# Patient Record
Sex: Female | Born: 1945 | Race: White | Hispanic: No | Smoking: Never smoker
Health system: Southern US, Community
[De-identification: ages and names within clinical notes are randomized; demographics above are authoritative.]

## PROBLEM LIST (undated history)

## (undated) DIAGNOSIS — M1990 Unspecified osteoarthritis, unspecified site: Secondary | ICD-10-CM

## (undated) DIAGNOSIS — S83206A Unspecified tear of unspecified meniscus, current injury, right knee, initial encounter: Secondary | ICD-10-CM

## (undated) DIAGNOSIS — E559 Vitamin D deficiency, unspecified: Secondary | ICD-10-CM

## (undated) DIAGNOSIS — F32A Depression, unspecified: Secondary | ICD-10-CM

## (undated) DIAGNOSIS — F419 Anxiety disorder, unspecified: Secondary | ICD-10-CM

## (undated) DIAGNOSIS — E785 Hyperlipidemia, unspecified: Secondary | ICD-10-CM

## (undated) DIAGNOSIS — J45990 Exercise induced bronchospasm: Secondary | ICD-10-CM

## (undated) DIAGNOSIS — K219 Gastro-esophageal reflux disease without esophagitis: Secondary | ICD-10-CM

## (undated) DIAGNOSIS — I10 Essential (primary) hypertension: Secondary | ICD-10-CM

## (undated) DIAGNOSIS — F329 Major depressive disorder, single episode, unspecified: Secondary | ICD-10-CM

## (undated) HISTORY — DX: Hyperlipidemia, unspecified: E78.5

## (undated) HISTORY — PX: FOOT GANGLION EXCISION: SHX1660

## (undated) HISTORY — PX: TUBAL LIGATION: SHX77

## (undated) HISTORY — PX: AUGMENTATION MAMMAPLASTY: SUR837

---

## 1997-07-09 ENCOUNTER — Ambulatory Visit: Admission: RE | Admit: 1997-07-09 | Discharge: 1997-07-09 | Payer: Self-pay | Admitting: Plastic Surgery

## 1997-07-19 ENCOUNTER — Other Ambulatory Visit: Admission: RE | Admit: 1997-07-19 | Discharge: 1997-07-19 | Payer: Self-pay | Admitting: Plastic Surgery

## 1997-11-23 ENCOUNTER — Other Ambulatory Visit: Admission: RE | Admit: 1997-11-23 | Discharge: 1997-11-23 | Payer: Self-pay | Admitting: *Deleted

## 1998-11-22 ENCOUNTER — Encounter: Payer: Self-pay | Admitting: *Deleted

## 1998-11-22 ENCOUNTER — Ambulatory Visit (HOSPITAL_COMMUNITY): Admission: RE | Admit: 1998-11-22 | Discharge: 1998-11-22 | Payer: Self-pay | Admitting: *Deleted

## 1998-11-27 ENCOUNTER — Other Ambulatory Visit: Admission: RE | Admit: 1998-11-27 | Discharge: 1998-11-27 | Payer: Self-pay | Admitting: *Deleted

## 1998-12-04 ENCOUNTER — Ambulatory Visit (HOSPITAL_COMMUNITY): Admission: RE | Admit: 1998-12-04 | Discharge: 1998-12-04 | Payer: Self-pay | Admitting: *Deleted

## 1999-12-10 ENCOUNTER — Ambulatory Visit (HOSPITAL_COMMUNITY): Admission: RE | Admit: 1999-12-10 | Discharge: 1999-12-10 | Payer: Self-pay | Admitting: *Deleted

## 1999-12-10 ENCOUNTER — Encounter: Payer: Self-pay | Admitting: *Deleted

## 1999-12-22 ENCOUNTER — Other Ambulatory Visit: Admission: RE | Admit: 1999-12-22 | Discharge: 1999-12-22 | Payer: Self-pay | Admitting: *Deleted

## 2000-12-27 ENCOUNTER — Encounter: Payer: Self-pay | Admitting: *Deleted

## 2000-12-27 ENCOUNTER — Other Ambulatory Visit: Admission: RE | Admit: 2000-12-27 | Discharge: 2000-12-27 | Payer: Self-pay | Admitting: *Deleted

## 2000-12-27 ENCOUNTER — Ambulatory Visit (HOSPITAL_COMMUNITY): Admission: RE | Admit: 2000-12-27 | Discharge: 2000-12-27 | Payer: Self-pay | Admitting: *Deleted

## 2001-05-27 ENCOUNTER — Encounter: Payer: Self-pay | Admitting: Internal Medicine

## 2001-05-27 ENCOUNTER — Encounter: Admission: RE | Admit: 2001-05-27 | Discharge: 2001-05-27 | Payer: Self-pay | Admitting: Internal Medicine

## 2001-12-28 ENCOUNTER — Other Ambulatory Visit: Admission: RE | Admit: 2001-12-28 | Discharge: 2001-12-28 | Payer: Self-pay | Admitting: *Deleted

## 2002-01-24 ENCOUNTER — Ambulatory Visit (HOSPITAL_COMMUNITY): Admission: RE | Admit: 2002-01-24 | Discharge: 2002-01-24 | Payer: Self-pay | Admitting: *Deleted

## 2002-01-24 ENCOUNTER — Encounter: Payer: Self-pay | Admitting: *Deleted

## 2002-03-30 HISTORY — PX: NASAL SINUS SURGERY: SHX719

## 2002-07-26 ENCOUNTER — Other Ambulatory Visit: Admission: RE | Admit: 2002-07-26 | Discharge: 2002-07-26 | Payer: Self-pay | Admitting: *Deleted

## 2002-10-03 ENCOUNTER — Encounter: Admission: RE | Admit: 2002-10-03 | Discharge: 2002-10-03 | Payer: Self-pay | Admitting: Internal Medicine

## 2002-10-03 ENCOUNTER — Encounter: Payer: Self-pay | Admitting: Internal Medicine

## 2003-01-12 ENCOUNTER — Other Ambulatory Visit: Admission: RE | Admit: 2003-01-12 | Discharge: 2003-01-12 | Payer: Self-pay | Admitting: *Deleted

## 2003-04-02 ENCOUNTER — Ambulatory Visit (HOSPITAL_COMMUNITY): Admission: RE | Admit: 2003-04-02 | Discharge: 2003-04-02 | Payer: Self-pay | Admitting: Internal Medicine

## 2004-01-16 ENCOUNTER — Other Ambulatory Visit: Admission: RE | Admit: 2004-01-16 | Discharge: 2004-01-16 | Payer: Self-pay | Admitting: *Deleted

## 2004-05-08 ENCOUNTER — Ambulatory Visit (HOSPITAL_COMMUNITY): Admission: RE | Admit: 2004-05-08 | Discharge: 2004-05-08 | Payer: Self-pay | Admitting: *Deleted

## 2005-01-26 ENCOUNTER — Other Ambulatory Visit: Admission: RE | Admit: 2005-01-26 | Discharge: 2005-01-26 | Payer: Self-pay | Admitting: Obstetrics and Gynecology

## 2005-06-16 ENCOUNTER — Ambulatory Visit (HOSPITAL_COMMUNITY): Admission: RE | Admit: 2005-06-16 | Discharge: 2005-06-16 | Payer: Self-pay | Admitting: Internal Medicine

## 2006-06-28 ENCOUNTER — Other Ambulatory Visit: Admission: RE | Admit: 2006-06-28 | Discharge: 2006-06-28 | Payer: Self-pay | Admitting: Obstetrics and Gynecology

## 2006-06-29 ENCOUNTER — Ambulatory Visit (HOSPITAL_COMMUNITY): Admission: RE | Admit: 2006-06-29 | Discharge: 2006-06-29 | Payer: Self-pay | Admitting: Internal Medicine

## 2006-11-03 ENCOUNTER — Emergency Department (HOSPITAL_COMMUNITY): Admission: EM | Admit: 2006-11-03 | Discharge: 2006-11-03 | Payer: Self-pay | Admitting: Emergency Medicine

## 2007-08-08 ENCOUNTER — Ambulatory Visit (HOSPITAL_COMMUNITY): Admission: RE | Admit: 2007-08-08 | Discharge: 2007-08-08 | Payer: Self-pay | Admitting: Internal Medicine

## 2009-01-08 ENCOUNTER — Ambulatory Visit (HOSPITAL_COMMUNITY): Admission: RE | Admit: 2009-01-08 | Discharge: 2009-01-08 | Payer: Self-pay | Admitting: Internal Medicine

## 2010-03-30 HISTORY — PX: TOTAL HIP ARTHROPLASTY: SHX124

## 2011-07-02 DIAGNOSIS — H40009 Preglaucoma, unspecified, unspecified eye: Secondary | ICD-10-CM | POA: Diagnosis not present

## 2011-07-02 DIAGNOSIS — H43819 Vitreous degeneration, unspecified eye: Secondary | ICD-10-CM | POA: Diagnosis not present

## 2011-07-28 DIAGNOSIS — M25559 Pain in unspecified hip: Secondary | ICD-10-CM | POA: Diagnosis not present

## 2011-07-29 DIAGNOSIS — M169 Osteoarthritis of hip, unspecified: Secondary | ICD-10-CM | POA: Diagnosis not present

## 2011-07-29 DIAGNOSIS — S79929A Unspecified injury of unspecified thigh, initial encounter: Secondary | ICD-10-CM | POA: Diagnosis not present

## 2011-07-29 DIAGNOSIS — S79919A Unspecified injury of unspecified hip, initial encounter: Secondary | ICD-10-CM | POA: Diagnosis not present

## 2011-07-29 DIAGNOSIS — M25559 Pain in unspecified hip: Secondary | ICD-10-CM | POA: Diagnosis not present

## 2011-08-04 DIAGNOSIS — M1610 Unilateral primary osteoarthritis, unspecified hip: Secondary | ICD-10-CM | POA: Diagnosis not present

## 2011-10-06 DIAGNOSIS — M169 Osteoarthritis of hip, unspecified: Secondary | ICD-10-CM | POA: Diagnosis not present

## 2011-10-06 DIAGNOSIS — M25559 Pain in unspecified hip: Secondary | ICD-10-CM | POA: Diagnosis not present

## 2011-10-09 DIAGNOSIS — M25559 Pain in unspecified hip: Secondary | ICD-10-CM | POA: Diagnosis not present

## 2011-10-09 DIAGNOSIS — M169 Osteoarthritis of hip, unspecified: Secondary | ICD-10-CM | POA: Diagnosis not present

## 2011-10-19 DIAGNOSIS — F329 Major depressive disorder, single episode, unspecified: Secondary | ICD-10-CM | POA: Diagnosis present

## 2011-10-19 DIAGNOSIS — M25559 Pain in unspecified hip: Secondary | ICD-10-CM | POA: Diagnosis not present

## 2011-10-19 DIAGNOSIS — E871 Hypo-osmolality and hyponatremia: Secondary | ICD-10-CM | POA: Diagnosis not present

## 2011-10-19 DIAGNOSIS — M169 Osteoarthritis of hip, unspecified: Secondary | ICD-10-CM | POA: Diagnosis present

## 2011-10-19 DIAGNOSIS — R262 Difficulty in walking, not elsewhere classified: Secondary | ICD-10-CM | POA: Diagnosis not present

## 2011-10-19 DIAGNOSIS — F3289 Other specified depressive episodes: Secondary | ICD-10-CM | POA: Diagnosis not present

## 2011-10-19 DIAGNOSIS — M353 Polymyalgia rheumatica: Secondary | ICD-10-CM | POA: Diagnosis not present

## 2011-10-19 DIAGNOSIS — M1610 Unilateral primary osteoarthritis, unspecified hip: Secondary | ICD-10-CM | POA: Diagnosis not present

## 2011-10-19 DIAGNOSIS — Z96649 Presence of unspecified artificial hip joint: Secondary | ICD-10-CM | POA: Diagnosis not present

## 2011-10-19 DIAGNOSIS — M6281 Muscle weakness (generalized): Secondary | ICD-10-CM | POA: Diagnosis not present

## 2011-10-19 DIAGNOSIS — Z471 Aftercare following joint replacement surgery: Secondary | ICD-10-CM | POA: Diagnosis not present

## 2011-10-23 DIAGNOSIS — R262 Difficulty in walking, not elsewhere classified: Secondary | ICD-10-CM | POA: Diagnosis not present

## 2011-10-23 DIAGNOSIS — M25559 Pain in unspecified hip: Secondary | ICD-10-CM | POA: Diagnosis not present

## 2011-10-23 DIAGNOSIS — M169 Osteoarthritis of hip, unspecified: Secondary | ICD-10-CM | POA: Diagnosis not present

## 2011-10-26 DIAGNOSIS — R262 Difficulty in walking, not elsewhere classified: Secondary | ICD-10-CM | POA: Diagnosis not present

## 2011-10-26 DIAGNOSIS — G4700 Insomnia, unspecified: Secondary | ICD-10-CM | POA: Diagnosis not present

## 2011-10-26 DIAGNOSIS — M1610 Unilateral primary osteoarthritis, unspecified hip: Secondary | ICD-10-CM | POA: Diagnosis not present

## 2011-10-26 DIAGNOSIS — M169 Osteoarthritis of hip, unspecified: Secondary | ICD-10-CM | POA: Diagnosis not present

## 2011-10-26 DIAGNOSIS — M25559 Pain in unspecified hip: Secondary | ICD-10-CM | POA: Diagnosis not present

## 2011-10-28 DIAGNOSIS — M25559 Pain in unspecified hip: Secondary | ICD-10-CM | POA: Diagnosis not present

## 2011-10-28 DIAGNOSIS — M169 Osteoarthritis of hip, unspecified: Secondary | ICD-10-CM | POA: Diagnosis not present

## 2011-10-28 DIAGNOSIS — R262 Difficulty in walking, not elsewhere classified: Secondary | ICD-10-CM | POA: Diagnosis not present

## 2011-10-29 DIAGNOSIS — M169 Osteoarthritis of hip, unspecified: Secondary | ICD-10-CM | POA: Diagnosis not present

## 2011-10-30 DIAGNOSIS — M25559 Pain in unspecified hip: Secondary | ICD-10-CM | POA: Diagnosis not present

## 2011-10-30 DIAGNOSIS — R262 Difficulty in walking, not elsewhere classified: Secondary | ICD-10-CM | POA: Diagnosis not present

## 2011-10-30 DIAGNOSIS — M169 Osteoarthritis of hip, unspecified: Secondary | ICD-10-CM | POA: Diagnosis not present

## 2011-11-02 DIAGNOSIS — M25559 Pain in unspecified hip: Secondary | ICD-10-CM | POA: Diagnosis not present

## 2011-11-02 DIAGNOSIS — M169 Osteoarthritis of hip, unspecified: Secondary | ICD-10-CM | POA: Diagnosis not present

## 2011-11-02 DIAGNOSIS — R262 Difficulty in walking, not elsewhere classified: Secondary | ICD-10-CM | POA: Diagnosis not present

## 2011-11-04 DIAGNOSIS — M25559 Pain in unspecified hip: Secondary | ICD-10-CM | POA: Diagnosis not present

## 2011-11-04 DIAGNOSIS — R262 Difficulty in walking, not elsewhere classified: Secondary | ICD-10-CM | POA: Diagnosis not present

## 2011-11-04 DIAGNOSIS — M169 Osteoarthritis of hip, unspecified: Secondary | ICD-10-CM | POA: Diagnosis not present

## 2011-11-06 DIAGNOSIS — M25559 Pain in unspecified hip: Secondary | ICD-10-CM | POA: Diagnosis not present

## 2011-11-06 DIAGNOSIS — M169 Osteoarthritis of hip, unspecified: Secondary | ICD-10-CM | POA: Diagnosis not present

## 2011-11-06 DIAGNOSIS — R262 Difficulty in walking, not elsewhere classified: Secondary | ICD-10-CM | POA: Diagnosis not present

## 2011-11-09 DIAGNOSIS — M25559 Pain in unspecified hip: Secondary | ICD-10-CM | POA: Diagnosis not present

## 2011-11-09 DIAGNOSIS — R262 Difficulty in walking, not elsewhere classified: Secondary | ICD-10-CM | POA: Diagnosis not present

## 2011-11-09 DIAGNOSIS — M169 Osteoarthritis of hip, unspecified: Secondary | ICD-10-CM | POA: Diagnosis not present

## 2011-11-11 DIAGNOSIS — M169 Osteoarthritis of hip, unspecified: Secondary | ICD-10-CM | POA: Diagnosis not present

## 2011-11-11 DIAGNOSIS — M25559 Pain in unspecified hip: Secondary | ICD-10-CM | POA: Diagnosis not present

## 2011-11-11 DIAGNOSIS — R262 Difficulty in walking, not elsewhere classified: Secondary | ICD-10-CM | POA: Diagnosis not present

## 2011-11-16 DIAGNOSIS — M169 Osteoarthritis of hip, unspecified: Secondary | ICD-10-CM | POA: Diagnosis not present

## 2011-11-16 DIAGNOSIS — R262 Difficulty in walking, not elsewhere classified: Secondary | ICD-10-CM | POA: Diagnosis not present

## 2011-11-16 DIAGNOSIS — M25559 Pain in unspecified hip: Secondary | ICD-10-CM | POA: Diagnosis not present

## 2011-11-18 DIAGNOSIS — M169 Osteoarthritis of hip, unspecified: Secondary | ICD-10-CM | POA: Diagnosis not present

## 2011-11-18 DIAGNOSIS — M25559 Pain in unspecified hip: Secondary | ICD-10-CM | POA: Diagnosis not present

## 2011-11-18 DIAGNOSIS — R262 Difficulty in walking, not elsewhere classified: Secondary | ICD-10-CM | POA: Diagnosis not present

## 2011-11-23 DIAGNOSIS — M169 Osteoarthritis of hip, unspecified: Secondary | ICD-10-CM | POA: Diagnosis not present

## 2011-11-23 DIAGNOSIS — M25559 Pain in unspecified hip: Secondary | ICD-10-CM | POA: Diagnosis not present

## 2011-11-23 DIAGNOSIS — R262 Difficulty in walking, not elsewhere classified: Secondary | ICD-10-CM | POA: Diagnosis not present

## 2011-11-25 DIAGNOSIS — M25559 Pain in unspecified hip: Secondary | ICD-10-CM | POA: Diagnosis not present

## 2011-11-25 DIAGNOSIS — R262 Difficulty in walking, not elsewhere classified: Secondary | ICD-10-CM | POA: Diagnosis not present

## 2011-11-25 DIAGNOSIS — M169 Osteoarthritis of hip, unspecified: Secondary | ICD-10-CM | POA: Diagnosis not present

## 2011-12-01 DIAGNOSIS — R262 Difficulty in walking, not elsewhere classified: Secondary | ICD-10-CM | POA: Diagnosis not present

## 2011-12-01 DIAGNOSIS — M25559 Pain in unspecified hip: Secondary | ICD-10-CM | POA: Diagnosis not present

## 2011-12-01 DIAGNOSIS — M169 Osteoarthritis of hip, unspecified: Secondary | ICD-10-CM | POA: Diagnosis not present

## 2011-12-03 DIAGNOSIS — M25559 Pain in unspecified hip: Secondary | ICD-10-CM | POA: Diagnosis not present

## 2011-12-03 DIAGNOSIS — R262 Difficulty in walking, not elsewhere classified: Secondary | ICD-10-CM | POA: Diagnosis not present

## 2011-12-03 DIAGNOSIS — M169 Osteoarthritis of hip, unspecified: Secondary | ICD-10-CM | POA: Diagnosis not present

## 2011-12-07 DIAGNOSIS — R262 Difficulty in walking, not elsewhere classified: Secondary | ICD-10-CM | POA: Diagnosis not present

## 2011-12-07 DIAGNOSIS — M25559 Pain in unspecified hip: Secondary | ICD-10-CM | POA: Diagnosis not present

## 2011-12-07 DIAGNOSIS — M169 Osteoarthritis of hip, unspecified: Secondary | ICD-10-CM | POA: Diagnosis not present

## 2012-02-04 DIAGNOSIS — H43819 Vitreous degeneration, unspecified eye: Secondary | ICD-10-CM | POA: Diagnosis not present

## 2012-02-04 DIAGNOSIS — H40019 Open angle with borderline findings, low risk, unspecified eye: Secondary | ICD-10-CM | POA: Diagnosis not present

## 2012-02-19 DIAGNOSIS — M899 Disorder of bone, unspecified: Secondary | ICD-10-CM | POA: Diagnosis not present

## 2012-02-19 DIAGNOSIS — M949 Disorder of cartilage, unspecified: Secondary | ICD-10-CM | POA: Diagnosis not present

## 2012-02-19 DIAGNOSIS — E7800 Pure hypercholesterolemia, unspecified: Secondary | ICD-10-CM | POA: Diagnosis not present

## 2012-02-19 DIAGNOSIS — M353 Polymyalgia rheumatica: Secondary | ICD-10-CM | POA: Diagnosis not present

## 2012-03-11 DIAGNOSIS — R05 Cough: Secondary | ICD-10-CM | POA: Diagnosis not present

## 2012-03-11 DIAGNOSIS — R059 Cough, unspecified: Secondary | ICD-10-CM | POA: Diagnosis not present

## 2012-03-21 DIAGNOSIS — M169 Osteoarthritis of hip, unspecified: Secondary | ICD-10-CM | POA: Diagnosis not present

## 2012-04-20 DIAGNOSIS — H40019 Open angle with borderline findings, low risk, unspecified eye: Secondary | ICD-10-CM | POA: Diagnosis not present

## 2012-10-06 ENCOUNTER — Other Ambulatory Visit (HOSPITAL_COMMUNITY): Payer: Self-pay | Admitting: Internal Medicine

## 2012-10-06 DIAGNOSIS — Z1331 Encounter for screening for depression: Secondary | ICD-10-CM | POA: Diagnosis not present

## 2012-10-06 DIAGNOSIS — E7800 Pure hypercholesterolemia, unspecified: Secondary | ICD-10-CM | POA: Diagnosis not present

## 2012-10-06 DIAGNOSIS — Z131 Encounter for screening for diabetes mellitus: Secondary | ICD-10-CM | POA: Diagnosis not present

## 2012-10-06 DIAGNOSIS — Z1231 Encounter for screening mammogram for malignant neoplasm of breast: Secondary | ICD-10-CM

## 2012-10-06 DIAGNOSIS — Z0000 Encounter for general adult medical examination without abnormal findings: Secondary | ICD-10-CM | POA: Diagnosis not present

## 2012-10-06 DIAGNOSIS — J45909 Unspecified asthma, uncomplicated: Secondary | ICD-10-CM | POA: Diagnosis not present

## 2012-10-06 DIAGNOSIS — M949 Disorder of cartilage, unspecified: Secondary | ICD-10-CM | POA: Diagnosis not present

## 2012-10-06 DIAGNOSIS — M899 Disorder of bone, unspecified: Secondary | ICD-10-CM | POA: Diagnosis not present

## 2012-10-07 DIAGNOSIS — J45909 Unspecified asthma, uncomplicated: Secondary | ICD-10-CM | POA: Diagnosis not present

## 2012-10-18 DIAGNOSIS — F411 Generalized anxiety disorder: Secondary | ICD-10-CM | POA: Diagnosis not present

## 2012-10-18 DIAGNOSIS — F330 Major depressive disorder, recurrent, mild: Secondary | ICD-10-CM | POA: Diagnosis not present

## 2012-10-19 ENCOUNTER — Ambulatory Visit (HOSPITAL_COMMUNITY)
Admission: RE | Admit: 2012-10-19 | Discharge: 2012-10-19 | Disposition: A | Discharge status: Home / Self Care | Payer: Medicare Other | Source: Ambulatory Visit | Attending: Internal Medicine | Admitting: Internal Medicine

## 2012-10-19 DIAGNOSIS — Z1231 Encounter for screening mammogram for malignant neoplasm of breast: Secondary | ICD-10-CM | POA: Diagnosis not present

## 2012-11-10 DIAGNOSIS — F330 Major depressive disorder, recurrent, mild: Secondary | ICD-10-CM | POA: Diagnosis not present

## 2012-11-10 DIAGNOSIS — F411 Generalized anxiety disorder: Secondary | ICD-10-CM | POA: Diagnosis not present

## 2012-11-29 DIAGNOSIS — M899 Disorder of bone, unspecified: Secondary | ICD-10-CM | POA: Diagnosis not present

## 2012-12-12 DIAGNOSIS — F330 Major depressive disorder, recurrent, mild: Secondary | ICD-10-CM | POA: Diagnosis not present

## 2013-01-12 DIAGNOSIS — F330 Major depressive disorder, recurrent, mild: Secondary | ICD-10-CM | POA: Diagnosis not present

## 2013-01-12 DIAGNOSIS — F410 Panic disorder [episodic paroxysmal anxiety] without agoraphobia: Secondary | ICD-10-CM | POA: Diagnosis not present

## 2013-03-13 DIAGNOSIS — F330 Major depressive disorder, recurrent, mild: Secondary | ICD-10-CM | POA: Diagnosis not present

## 2013-05-04 ENCOUNTER — Other Ambulatory Visit: Payer: Self-pay | Admitting: Internal Medicine

## 2013-05-04 ENCOUNTER — Other Ambulatory Visit (HOSPITAL_COMMUNITY)
Admission: RE | Admit: 2013-05-04 | Discharge: 2013-05-04 | Disposition: A | Discharge status: Home / Self Care | Payer: Medicare Other | Source: Ambulatory Visit | Attending: Internal Medicine | Admitting: Internal Medicine

## 2013-05-04 DIAGNOSIS — Z1151 Encounter for screening for human papillomavirus (HPV): Secondary | ICD-10-CM | POA: Insufficient documentation

## 2013-05-04 DIAGNOSIS — Z01419 Encounter for gynecological examination (general) (routine) without abnormal findings: Secondary | ICD-10-CM | POA: Insufficient documentation

## 2013-05-04 DIAGNOSIS — N949 Unspecified condition associated with female genital organs and menstrual cycle: Secondary | ICD-10-CM | POA: Diagnosis not present

## 2013-05-10 DIAGNOSIS — N949 Unspecified condition associated with female genital organs and menstrual cycle: Secondary | ICD-10-CM | POA: Diagnosis not present

## 2013-05-17 ENCOUNTER — Other Ambulatory Visit: Payer: Self-pay | Admitting: Internal Medicine

## 2013-05-17 DIAGNOSIS — K5732 Diverticulitis of large intestine without perforation or abscess without bleeding: Secondary | ICD-10-CM | POA: Diagnosis not present

## 2013-05-17 DIAGNOSIS — K5792 Diverticulitis of intestine, part unspecified, without perforation or abscess without bleeding: Secondary | ICD-10-CM

## 2013-05-19 ENCOUNTER — Ambulatory Visit
Admission: RE | Admit: 2013-05-19 | Discharge: 2013-05-19 | Disposition: A | Discharge status: Home / Self Care | Payer: Medicare Other | Source: Ambulatory Visit | Attending: Internal Medicine | Admitting: Internal Medicine

## 2013-05-19 DIAGNOSIS — K5792 Diverticulitis of intestine, part unspecified, without perforation or abscess without bleeding: Secondary | ICD-10-CM

## 2013-05-19 DIAGNOSIS — R1032 Left lower quadrant pain: Secondary | ICD-10-CM | POA: Diagnosis not present

## 2013-05-19 MED ORDER — IOHEXOL 300 MG/ML  SOLN
100.0000 mL | Freq: Once | INTRAMUSCULAR | Status: AC | PRN
  Administered 2013-05-19: 100 mL via INTRAVENOUS

## 2013-06-01 DIAGNOSIS — F411 Generalized anxiety disorder: Secondary | ICD-10-CM | POA: Diagnosis not present

## 2013-06-01 DIAGNOSIS — F330 Major depressive disorder, recurrent, mild: Secondary | ICD-10-CM | POA: Diagnosis not present

## 2013-09-04 DIAGNOSIS — F3131 Bipolar disorder, current episode depressed, mild: Secondary | ICD-10-CM | POA: Diagnosis not present

## 2013-11-02 DIAGNOSIS — F329 Major depressive disorder, single episode, unspecified: Secondary | ICD-10-CM | POA: Diagnosis not present

## 2013-11-02 DIAGNOSIS — M949 Disorder of cartilage, unspecified: Secondary | ICD-10-CM | POA: Diagnosis not present

## 2013-11-02 DIAGNOSIS — J45909 Unspecified asthma, uncomplicated: Secondary | ICD-10-CM | POA: Diagnosis not present

## 2013-11-02 DIAGNOSIS — Z23 Encounter for immunization: Secondary | ICD-10-CM | POA: Diagnosis not present

## 2013-11-02 DIAGNOSIS — E7800 Pure hypercholesterolemia, unspecified: Secondary | ICD-10-CM | POA: Diagnosis not present

## 2013-11-02 DIAGNOSIS — M899 Disorder of bone, unspecified: Secondary | ICD-10-CM | POA: Diagnosis not present

## 2013-11-02 DIAGNOSIS — Z1331 Encounter for screening for depression: Secondary | ICD-10-CM | POA: Diagnosis not present

## 2013-11-02 DIAGNOSIS — Z0000 Encounter for general adult medical examination without abnormal findings: Secondary | ICD-10-CM | POA: Diagnosis not present

## 2013-11-06 ENCOUNTER — Other Ambulatory Visit (HOSPITAL_COMMUNITY): Payer: Self-pay | Admitting: Internal Medicine

## 2013-11-06 DIAGNOSIS — Z1231 Encounter for screening mammogram for malignant neoplasm of breast: Secondary | ICD-10-CM

## 2013-11-10 ENCOUNTER — Ambulatory Visit (HOSPITAL_COMMUNITY)
Admission: RE | Admit: 2013-11-10 | Discharge: 2013-11-10 | Disposition: A | Discharge status: Home / Self Care | Payer: Medicare Other | Source: Ambulatory Visit | Attending: Internal Medicine | Admitting: Internal Medicine

## 2013-11-10 DIAGNOSIS — Z1231 Encounter for screening mammogram for malignant neoplasm of breast: Secondary | ICD-10-CM | POA: Diagnosis not present

## 2013-11-29 DIAGNOSIS — F3131 Bipolar disorder, current episode depressed, mild: Secondary | ICD-10-CM | POA: Diagnosis not present

## 2014-04-06 DIAGNOSIS — J069 Acute upper respiratory infection, unspecified: Secondary | ICD-10-CM | POA: Diagnosis not present

## 2014-04-18 DIAGNOSIS — Z471 Aftercare following joint replacement surgery: Secondary | ICD-10-CM | POA: Diagnosis not present

## 2014-04-18 DIAGNOSIS — Z96641 Presence of right artificial hip joint: Secondary | ICD-10-CM | POA: Diagnosis not present

## 2014-07-25 DIAGNOSIS — F3341 Major depressive disorder, recurrent, in partial remission: Secondary | ICD-10-CM | POA: Diagnosis not present

## 2014-09-05 DIAGNOSIS — Z471 Aftercare following joint replacement surgery: Secondary | ICD-10-CM | POA: Diagnosis not present

## 2014-09-05 DIAGNOSIS — M5416 Radiculopathy, lumbar region: Secondary | ICD-10-CM | POA: Diagnosis not present

## 2014-09-05 DIAGNOSIS — Z96641 Presence of right artificial hip joint: Secondary | ICD-10-CM | POA: Diagnosis not present

## 2014-09-26 DIAGNOSIS — M5416 Radiculopathy, lumbar region: Secondary | ICD-10-CM | POA: Diagnosis not present

## 2014-11-01 DIAGNOSIS — M5416 Radiculopathy, lumbar region: Secondary | ICD-10-CM | POA: Diagnosis not present

## 2014-11-01 DIAGNOSIS — M25561 Pain in right knee: Secondary | ICD-10-CM | POA: Diagnosis not present

## 2014-11-12 DIAGNOSIS — M25561 Pain in right knee: Secondary | ICD-10-CM | POA: Diagnosis not present

## 2014-11-13 DIAGNOSIS — K5792 Diverticulitis of intestine, part unspecified, without perforation or abscess without bleeding: Secondary | ICD-10-CM | POA: Diagnosis not present

## 2014-11-13 DIAGNOSIS — F411 Generalized anxiety disorder: Secondary | ICD-10-CM | POA: Diagnosis not present

## 2014-11-13 DIAGNOSIS — Z23 Encounter for immunization: Secondary | ICD-10-CM | POA: Diagnosis not present

## 2014-11-13 DIAGNOSIS — Z79899 Other long term (current) drug therapy: Secondary | ICD-10-CM | POA: Diagnosis not present

## 2014-11-13 DIAGNOSIS — E559 Vitamin D deficiency, unspecified: Secondary | ICD-10-CM | POA: Diagnosis not present

## 2014-11-13 DIAGNOSIS — F331 Major depressive disorder, recurrent, moderate: Secondary | ICD-10-CM | POA: Diagnosis not present

## 2014-11-13 DIAGNOSIS — K5900 Constipation, unspecified: Secondary | ICD-10-CM | POA: Diagnosis not present

## 2014-11-29 DIAGNOSIS — M23351 Other meniscus derangements, posterior horn of lateral meniscus, right knee: Secondary | ICD-10-CM | POA: Diagnosis not present

## 2014-12-18 DIAGNOSIS — Z23 Encounter for immunization: Secondary | ICD-10-CM | POA: Diagnosis not present

## 2014-12-18 DIAGNOSIS — F331 Major depressive disorder, recurrent, moderate: Secondary | ICD-10-CM | POA: Diagnosis not present

## 2014-12-18 DIAGNOSIS — F411 Generalized anxiety disorder: Secondary | ICD-10-CM | POA: Diagnosis not present

## 2014-12-18 DIAGNOSIS — K5900 Constipation, unspecified: Secondary | ICD-10-CM | POA: Diagnosis not present

## 2014-12-28 DIAGNOSIS — M23351 Other meniscus derangements, posterior horn of lateral meniscus, right knee: Secondary | ICD-10-CM | POA: Diagnosis not present

## 2015-01-22 ENCOUNTER — Ambulatory Visit: Payer: Self-pay | Admitting: Orthopedic Surgery

## 2015-01-22 DIAGNOSIS — Z136 Encounter for screening for cardiovascular disorders: Secondary | ICD-10-CM | POA: Diagnosis not present

## 2015-01-22 DIAGNOSIS — K5792 Diverticulitis of intestine, part unspecified, without perforation or abscess without bleeding: Secondary | ICD-10-CM | POA: Diagnosis not present

## 2015-01-22 DIAGNOSIS — E559 Vitamin D deficiency, unspecified: Secondary | ICD-10-CM | POA: Diagnosis not present

## 2015-01-22 DIAGNOSIS — F411 Generalized anxiety disorder: Secondary | ICD-10-CM | POA: Diagnosis not present

## 2015-01-22 DIAGNOSIS — F331 Major depressive disorder, recurrent, moderate: Secondary | ICD-10-CM | POA: Diagnosis not present

## 2015-01-22 DIAGNOSIS — K5900 Constipation, unspecified: Secondary | ICD-10-CM | POA: Diagnosis not present

## 2015-01-22 NOTE — Progress Notes (Signed)
Preoperative surgical orders have been place into the [facility] hospital system for Sheila Burke on 01/22/2015, 1:56 PM  by Mickel Crow for surgery on 02-06-2015.  Preop Knee Scope orders including IV Tylenol and IV Decadron as long as there are no contraindications to the above medications. [name], PA-C

## 2015-01-24 ENCOUNTER — Other Ambulatory Visit: Payer: Self-pay | Admitting: Family Medicine

## 2015-01-24 DIAGNOSIS — Z136 Encounter for screening for cardiovascular disorders: Secondary | ICD-10-CM | POA: Diagnosis not present

## 2015-01-24 DIAGNOSIS — E559 Vitamin D deficiency, unspecified: Secondary | ICD-10-CM | POA: Diagnosis not present

## 2015-01-24 DIAGNOSIS — F331 Major depressive disorder, recurrent, moderate: Secondary | ICD-10-CM | POA: Diagnosis not present

## 2015-01-24 DIAGNOSIS — F411 Generalized anxiety disorder: Secondary | ICD-10-CM | POA: Diagnosis not present

## 2015-01-29 ENCOUNTER — Other Ambulatory Visit: Payer: Self-pay | Admitting: Family Medicine

## 2015-01-29 DIAGNOSIS — M8580 Other specified disorders of bone density and structure, unspecified site: Secondary | ICD-10-CM

## 2015-01-30 ENCOUNTER — Encounter (HOSPITAL_BASED_OUTPATIENT_CLINIC_OR_DEPARTMENT_OTHER): Payer: Self-pay | Admitting: *Deleted

## 2015-01-30 NOTE — Progress Notes (Signed)
NPO AFTER MN.  ARRIVE AT 1030.  NEEDS HG. WILL TAKE LEXAPRO AND EFFEXOR AM DOS W/ SIPS OF WATER.

## 2015-02-06 ENCOUNTER — Ambulatory Visit (HOSPITAL_BASED_OUTPATIENT_CLINIC_OR_DEPARTMENT_OTHER)
Admission: RE | Admit: 2015-02-06 | Discharge: 2015-02-06 | Disposition: A | Discharge status: Home / Self Care | Payer: Medicare Other | Source: Ambulatory Visit | Attending: Orthopedic Surgery | Admitting: Orthopedic Surgery

## 2015-02-06 ENCOUNTER — Encounter (HOSPITAL_BASED_OUTPATIENT_CLINIC_OR_DEPARTMENT_OTHER): Payer: Self-pay | Admitting: Anesthesiology

## 2015-02-06 ENCOUNTER — Ambulatory Visit (HOSPITAL_BASED_OUTPATIENT_CLINIC_OR_DEPARTMENT_OTHER): Payer: Medicare Other | Admitting: Anesthesiology

## 2015-02-06 ENCOUNTER — Encounter (HOSPITAL_BASED_OUTPATIENT_CLINIC_OR_DEPARTMENT_OTHER)
Admission: RE | Disposition: A | Discharge status: Home / Self Care | Payer: Self-pay | Source: Ambulatory Visit | Attending: Orthopedic Surgery

## 2015-02-06 DIAGNOSIS — J45990 Exercise induced bronchospasm: Secondary | ICD-10-CM | POA: Insufficient documentation

## 2015-02-06 DIAGNOSIS — S83289A Other tear of lateral meniscus, current injury, unspecified knee, initial encounter: Secondary | ICD-10-CM | POA: Diagnosis present

## 2015-02-06 DIAGNOSIS — K219 Gastro-esophageal reflux disease without esophagitis: Secondary | ICD-10-CM | POA: Diagnosis not present

## 2015-02-06 DIAGNOSIS — X58XXXA Exposure to other specified factors, initial encounter: Secondary | ICD-10-CM | POA: Insufficient documentation

## 2015-02-06 DIAGNOSIS — F329 Major depressive disorder, single episode, unspecified: Secondary | ICD-10-CM | POA: Diagnosis not present

## 2015-02-06 DIAGNOSIS — S83281A Other tear of lateral meniscus, current injury, right knee, initial encounter: Secondary | ICD-10-CM | POA: Diagnosis not present

## 2015-02-06 DIAGNOSIS — F419 Anxiety disorder, unspecified: Secondary | ICD-10-CM | POA: Insufficient documentation

## 2015-02-06 DIAGNOSIS — Z96641 Presence of right artificial hip joint: Secondary | ICD-10-CM | POA: Insufficient documentation

## 2015-02-06 DIAGNOSIS — M1990 Unspecified osteoarthritis, unspecified site: Secondary | ICD-10-CM | POA: Diagnosis not present

## 2015-02-06 DIAGNOSIS — M25561 Pain in right knee: Secondary | ICD-10-CM | POA: Diagnosis present

## 2015-02-06 DIAGNOSIS — Z79899 Other long term (current) drug therapy: Secondary | ICD-10-CM | POA: Diagnosis not present

## 2015-02-06 DIAGNOSIS — M23351 Other meniscus derangements, posterior horn of lateral meniscus, right knee: Secondary | ICD-10-CM | POA: Diagnosis not present

## 2015-02-06 HISTORY — DX: Unspecified osteoarthritis, unspecified site: M19.90

## 2015-02-06 HISTORY — DX: Gastro-esophageal reflux disease without esophagitis: K21.9

## 2015-02-06 HISTORY — DX: Unspecified tear of unspecified meniscus, current injury, right knee, initial encounter: S83.206A

## 2015-02-06 HISTORY — DX: Vitamin D deficiency, unspecified: E55.9

## 2015-02-06 HISTORY — DX: Major depressive disorder, single episode, unspecified: F32.9

## 2015-02-06 HISTORY — DX: Exercise induced bronchospasm: J45.990

## 2015-02-06 HISTORY — DX: Anxiety disorder, unspecified: F41.9

## 2015-02-06 HISTORY — PX: KNEE ARTHROSCOPY: SHX127

## 2015-02-06 HISTORY — DX: Depression, unspecified: F32.A

## 2015-02-06 LAB — POCT HEMOGLOBIN-HEMACUE: Hemoglobin: 14.3 g/dL (ref 12.0–15.0)

## 2015-02-06 SURGERY — ARTHROSCOPY, KNEE
Anesthesia: General | Site: Knee | Laterality: Right

## 2015-02-06 MED ORDER — CEFAZOLIN SODIUM-DEXTROSE 2-3 GM-% IV SOLR
INTRAVENOUS | Status: AC
  Filled 2015-02-06: qty 50

## 2015-02-06 MED ORDER — LIDOCAINE HCL (CARDIAC) 20 MG/ML IV SOLN
INTRAVENOUS | Status: DC | PRN
  Administered 2015-02-06: 60 mg via INTRAVENOUS

## 2015-02-06 MED ORDER — SODIUM CHLORIDE 0.9 % IR SOLN
Status: DC | PRN
  Administered 2015-02-06: 6000 mL

## 2015-02-06 MED ORDER — FENTANYL CITRATE (PF) 100 MCG/2ML IJ SOLN
INTRAMUSCULAR | Status: DC | PRN
  Administered 2015-02-06 (×4): 25 ug via INTRAVENOUS

## 2015-02-06 MED ORDER — ACETAMINOPHEN 10 MG/ML IV SOLN
1000.0000 mg | Freq: Once | INTRAVENOUS | Status: AC
  Administered 2015-02-06: 1000 mg via INTRAVENOUS
  Filled 2015-02-06: qty 100

## 2015-02-06 MED ORDER — BUPIVACAINE-EPINEPHRINE 0.25% -1:200000 IJ SOLN
INTRAMUSCULAR | Status: DC | PRN
  Administered 2015-02-06: 20 mL

## 2015-02-06 MED ORDER — LIDOCAINE HCL (CARDIAC) 20 MG/ML IV SOLN
INTRAVENOUS | Status: AC
  Filled 2015-02-06: qty 5

## 2015-02-06 MED ORDER — FENTANYL CITRATE (PF) 100 MCG/2ML IJ SOLN
25.0000 ug | INTRAMUSCULAR | Status: DC | PRN
  Filled 2015-02-06: qty 1

## 2015-02-06 MED ORDER — OXYCODONE HCL 5 MG PO TABS
ORAL_TABLET | ORAL | Status: AC
  Filled 2015-02-06: qty 1

## 2015-02-06 MED ORDER — ONDANSETRON HCL 4 MG/2ML IJ SOLN
INTRAMUSCULAR | Status: DC | PRN
  Administered 2015-02-06: 4 mg via INTRAVENOUS

## 2015-02-06 MED ORDER — MIDAZOLAM HCL 2 MG/2ML IJ SOLN
INTRAMUSCULAR | Status: AC
  Filled 2015-02-06: qty 4

## 2015-02-06 MED ORDER — OXYCODONE HCL 5 MG PO TABS
5.0000 mg | ORAL_TABLET | Freq: Once | ORAL | Status: AC
  Administered 2015-02-06: 5 mg via ORAL
  Filled 2015-02-06: qty 1

## 2015-02-06 MED ORDER — PROMETHAZINE HCL 25 MG/ML IJ SOLN
6.2500 mg | INTRAMUSCULAR | Status: DC | PRN
  Filled 2015-02-06: qty 1

## 2015-02-06 MED ORDER — FENTANYL CITRATE (PF) 100 MCG/2ML IJ SOLN
INTRAMUSCULAR | Status: AC
  Filled 2015-02-06: qty 4

## 2015-02-06 MED ORDER — GLYCOPYRROLATE 0.2 MG/ML IJ SOLN
INTRAMUSCULAR | Status: DC | PRN
  Administered 2015-02-06: 0.2 mg via INTRAVENOUS

## 2015-02-06 MED ORDER — CHLORHEXIDINE GLUCONATE 4 % EX LIQD
60.0000 mL | Freq: Once | CUTANEOUS | Status: DC
  Filled 2015-02-06: qty 60

## 2015-02-06 MED ORDER — PROPOFOL 10 MG/ML IV BOLUS
INTRAVENOUS | Status: AC
  Filled 2015-02-06: qty 20

## 2015-02-06 MED ORDER — DEXAMETHASONE SODIUM PHOSPHATE 10 MG/ML IJ SOLN
INTRAMUSCULAR | Status: DC | PRN
  Administered 2015-02-06: 10 mg via INTRAVENOUS

## 2015-02-06 MED ORDER — HYDROCODONE-ACETAMINOPHEN 5-325 MG PO TABS: 1.0000 | ORAL_TABLET | ORAL | Status: DC | PRN

## 2015-02-06 MED ORDER — KETOROLAC TROMETHAMINE 30 MG/ML IJ SOLN
INTRAMUSCULAR | Status: DC | PRN
  Administered 2015-02-06: 30 mg via INTRAVENOUS

## 2015-02-06 MED ORDER — ONDANSETRON HCL 4 MG/2ML IJ SOLN
INTRAMUSCULAR | Status: AC
  Filled 2015-02-06: qty 2

## 2015-02-06 MED ORDER — MEPERIDINE HCL 25 MG/ML IJ SOLN
6.2500 mg | INTRAMUSCULAR | Status: DC | PRN
  Filled 2015-02-06: qty 1

## 2015-02-06 MED ORDER — SODIUM CHLORIDE 0.9 % IV SOLN
INTRAVENOUS | Status: DC
  Filled 2015-02-06: qty 1000

## 2015-02-06 MED ORDER — DEXAMETHASONE SODIUM PHOSPHATE 10 MG/ML IJ SOLN
INTRAMUSCULAR | Status: AC
  Filled 2015-02-06: qty 1

## 2015-02-06 MED ORDER — MIDAZOLAM HCL 5 MG/5ML IJ SOLN
INTRAMUSCULAR | Status: DC | PRN
  Administered 2015-02-06: 2 mg via INTRAVENOUS

## 2015-02-06 MED ORDER — PROPOFOL 10 MG/ML IV BOLUS
INTRAVENOUS | Status: DC | PRN
  Administered 2015-02-06: 180 mg via INTRAVENOUS

## 2015-02-06 MED ORDER — CEFAZOLIN SODIUM-DEXTROSE 2-3 GM-% IV SOLR
2.0000 g | INTRAVENOUS | Status: AC
  Administered 2015-02-06: 2 g via INTRAVENOUS
  Filled 2015-02-06: qty 50

## 2015-02-06 MED ORDER — METHOCARBAMOL 500 MG PO TABS: 500.0000 mg | ORAL_TABLET | Freq: Four times a day (QID) | ORAL | Status: DC

## 2015-02-06 MED ORDER — ONDANSETRON HCL 4 MG/2ML IJ SOLN
4.0000 mg | Freq: Once | INTRAMUSCULAR | Status: DC | PRN
  Filled 2015-02-06: qty 2

## 2015-02-06 MED ORDER — HYDROMORPHONE HCL 1 MG/ML IJ SOLN
0.2500 mg | INTRAMUSCULAR | Status: DC | PRN
  Filled 2015-02-06: qty 1

## 2015-02-06 MED ORDER — KETOROLAC TROMETHAMINE 30 MG/ML IJ SOLN
INTRAMUSCULAR | Status: AC
  Filled 2015-02-06: qty 1

## 2015-02-06 MED ORDER — DEXAMETHASONE SODIUM PHOSPHATE 10 MG/ML IJ SOLN
10.0000 mg | Freq: Once | INTRAMUSCULAR | Status: DC
  Filled 2015-02-06: qty 1

## 2015-02-06 MED ORDER — LACTATED RINGERS IV SOLN
INTRAVENOUS | Status: DC
  Administered 2015-02-06 (×3): via INTRAVENOUS
  Filled 2015-02-06: qty 1000

## 2015-02-06 SURGICAL SUPPLY — 41 items
BANDAGE ELASTIC 6 VELCRO ST LF (GAUZE/BANDAGES/DRESSINGS) ×2 IMPLANT
BLADE 4.2CUDA (BLADE) ×2 IMPLANT
BLADE CUDA SHAVER 3.5 (BLADE) IMPLANT
BLADE CUTTER GATOR 3.5 (BLADE) IMPLANT
CANISTER SUCT LVC 12 LTR MEDI- (MISCELLANEOUS) ×2 IMPLANT
CANISTER SUCTION 2500CC (MISCELLANEOUS) IMPLANT
CLOTH BEACON ORANGE TIMEOUT ST (SAFETY) ×2 IMPLANT
CUFF TOURN SGL QUICK 34 (TOURNIQUET CUFF) ×2
CUFF TRNQT CYL 34X4X40X1 (TOURNIQUET CUFF) ×1 IMPLANT
DRAPE ARTHROSCOPY W/POUCH 114 (DRAPES) ×2 IMPLANT
DRAPE U-SHAPE 47X51 STRL (DRAPES) ×2 IMPLANT
DRSG EMULSION OIL 3X3 NADH (GAUZE/BANDAGES/DRESSINGS) ×2 IMPLANT
DRSG PAD ABDOMINAL 8X10 ST (GAUZE/BANDAGES/DRESSINGS) ×1 IMPLANT
DURAPREP 26ML APPLICATOR (WOUND CARE) ×2 IMPLANT
ELECT MENISCUS 165MM 90D (ELECTRODE) IMPLANT
ELECT REM PT RETURN 9FT ADLT (ELECTROSURGICAL)
ELECTRODE REM PT RTRN 9FT ADLT (ELECTROSURGICAL) IMPLANT
GLOVE BIO SURGEON STRL SZ8 (GLOVE) ×2 IMPLANT
GLOVE INDICATOR 8.0 STRL GRN (GLOVE) ×2 IMPLANT
GOWN STRL REUS W/ TWL LRG LVL3 (GOWN DISPOSABLE) ×2 IMPLANT
GOWN STRL REUS W/TWL LRG LVL3 (GOWN DISPOSABLE) ×4
IV NS IRRIG 3000ML ARTHROMATIC (IV SOLUTION) ×2 IMPLANT
KIT ROOM TURNOVER WOR (KITS) ×2 IMPLANT
KNEE WRAP E Z 3 GEL PACK (MISCELLANEOUS) ×2 IMPLANT
MANIFOLD NEPTUNE II (INSTRUMENTS) IMPLANT
PACK ARTHROSCOPY DSU (CUSTOM PROCEDURE TRAY) ×2 IMPLANT
PACK BASIN DAY SURGERY FS (CUSTOM PROCEDURE TRAY) ×2 IMPLANT
PADDING CAST ABS 4INX4YD NS (CAST SUPPLIES) ×1
PADDING CAST ABS COTTON 4X4 ST (CAST SUPPLIES) ×1 IMPLANT
PADDING CAST COTTON 6X4 STRL (CAST SUPPLIES) ×2 IMPLANT
PENCIL BUTTON HOLSTER BLD 10FT (ELECTRODE) IMPLANT
SET ARTHROSCOPY TUBING (MISCELLANEOUS) ×2
SET ARTHROSCOPY TUBING LN (MISCELLANEOUS) ×1 IMPLANT
SPONGE GAUZE 4X4 12PLY (GAUZE/BANDAGES/DRESSINGS) ×2 IMPLANT
SPONGE GAUZE 4X4 12PLY STER LF (GAUZE/BANDAGES/DRESSINGS) ×2 IMPLANT
SUT ETHILON 4 0 PS 2 18 (SUTURE) ×2 IMPLANT
TOWEL OR 17X24 6PK STRL BLUE (TOWEL DISPOSABLE) ×2 IMPLANT
TUBE CONNECTING 12X1/4 (SUCTIONS) IMPLANT
WAND 30 DEG SABER W/CORD (SURGICAL WAND) IMPLANT
WAND 90 DEG TURBOVAC W/CORD (SURGICAL WAND) ×1 IMPLANT
WATER STERILE IRR 500ML POUR (IV SOLUTION) ×2 IMPLANT

## 2015-02-06 NOTE — Transfer of Care (Signed)
Immediate Anesthesia Transfer of Care Note  Patient: Sheila Burke  Procedure(s) Performed: Procedure(s) (LRB): ARTHROSCOPY RIGHT KNEE WITH LATERAL MENSICAL DEBRIDEMENT (Right)  Patient Location: PACU  Anesthesia Type: General  Level of Consciousness: awake, sedated, patient cooperative and responds to stimulation  Airway & Oxygen Therapy: Patient Spontanous Breathing and Patient connected to face mask oxygen  Post-op Assessment: Report given to PACU RN, Post -op Vital signs reviewed and stable and Patient moving all extremities  Post vital signs: Reviewed and stable  Complications: No apparent anesthesia complications

## 2015-02-06 NOTE — Anesthesia Procedure Notes (Signed)
Procedure Name: LMA Insertion Date/Time: 02/06/2015 12:36 PM Performed by: Justice Rocher Pre-anesthesia Checklist: Patient identified, Emergency Drugs available, Suction available and Patient being monitored Patient Re-evaluated:Patient Re-evaluated prior to inductionOxygen Delivery Method: Circle System Utilized Preoxygenation: Pre-oxygenation with 100% oxygen Intubation Type: IV induction Ventilation: Mask ventilation without difficulty LMA: LMA inserted LMA Size: 4.0 Number of attempts: 1 Airway Equipment and Method: Bite block Placement Confirmation: positive ETCO2 Tube secured with: Tape Dental Injury: Teeth and Oropharynx as per pre-operative assessment

## 2015-02-06 NOTE — Discharge Instructions (Signed)
Dr. Gaynelle Arabian Total Joint Specialist [facility] [address] [place], [place] [address] [phone]   Arthroscopic Procedure, Knee An arthroscopic procedure can find what is wrong with your knee. PROCEDURE Arthroscopy is a surgical technique that allows your orthopedic surgeon to diagnose and treat your knee injury with accuracy. They will look into your knee through a small instrument. This is almost like a small (pencil sized) telescope. Because arthroscopy affects your knee less than open knee surgery, you can anticipate a more rapid recovery. Taking an active role by following your caregiver's instructions will help with rapid and complete recovery. Use crutches, rest, elevation, ice, and knee exercises as instructed. The length of recovery depends on various factors including type of injury, age, physical condition, medical conditions, and your rehabilitation. Your knee is the joint between the large bones (femur and tibia) in your leg. Cartilage covers these bone ends which are smooth and slippery and allow your knee to bend and move smoothly. Two menisci, thick, semi-lunar shaped pads of cartilage which form a rim inside the joint, help absorb shock and stabilize your knee. Ligaments bind the bones together and support your knee joint. Muscles move the joint, help support your knee, and take stress off the joint itself. Because of this all programs and physical therapy to rehabilitate an injured or repaired knee require rebuilding and strengthening your muscles. AFTER THE PROCEDURE  After the procedure, you will be moved to a recovery area until most of the effects of the medication have worn off. Your caregiver will discuss the test results with you.   Only take over-the-counter or prescription medicines for pain, discomfort, or fever as directed by your caregiver.  SEEK MEDICAL CARE IF:   You have increased bleeding from your wounds.   You see  redness, swelling, or have increasing pain in your wounds.   You have pus coming from your wound.   You have an oral temperature above 102 F (38.9 C).   You notice a bad smell coming from the wound or dressing.   You have severe pain with any motion of your knee.  SEEK IMMEDIATE MEDICAL CARE IF:   You develop a rash.   You have difficulty breathing.   You have any allergic problems.  FURTHER INSTRUCTIONS:    ICE to the affected knee every three hours for 30 minutes at a time and then as needed for pain and swelling.  Continue to use ice on the knee for pain and swelling from surgery. You may notice swelling that will progress down to the foot and ankle.  This is normal after surgery.  Elevate the leg when you are not up walking on it.    DIET You may resume your previous home diet once your are discharged from the hospital.  DRESSING / WOUND CARE / SHOWERING You may start showering two days after being discharged home but do not submerge the incisions under water.  Change dressing 48 hours after the procedure and then cover the small incisions with band aids until your follow up visit. Change the surgical dressings daily and reapply a dry dressing each time.   ACTIVITY Walk with your walker as instructed. Use walker as long as suggested by your caregivers. Avoid periods of inactivity such as sitting longer than an hour when not asleep. This helps prevent blood clots.  You may resume a sexual relationship in one month or when given the OK by your doctor.  You may return  to work once you are cleared by your doctor.  Do not drive a car for 6 weeks or until released by you surgeon.  Do not drive while taking narcotics.  WEIGHT BEARING AS TOLERATED  POSTOPERATIVE CONSTIPATION PROTOCOL Constipation - defined medically as fewer than three stools per week and severe constipation as less than one stool per week.  One of the most common issues patients have following surgery is  constipation.  Even if you have a regular bowel pattern at home, your normal regimen is likely to be disrupted due to multiple reasons following surgery.  Combination of anesthesia, postoperative narcotics, change in appetite and fluid intake all can affect your bowels.  In order to avoid complications following surgery, here are some recommendations in order to help you during your recovery period.  Colace (docusate) - Pick up an over-the-counter form of Colace or another stool softener and take twice a day as long as you are requiring postoperative pain medications.  Take with a full glass of water daily.  If you experience loose stools or diarrhea, hold the colace until you stool forms back up.  If your symptoms do not get better within 1 week or if they get worse, check with your doctor.  Dulcolax (bisacodyl) - Pick up over-the-counter and take as directed by the product packaging as needed to assist with the movement of your bowels.  Take with a full glass of water.  Use this product as needed if not relieved by Colace only.   MiraLax (polyethylene glycol) - Pick up over-the-counter to have on hand.  MiraLax is a solution that will increase the amount of water in your bowels to assist with bowel movements.  Take as directed and can mix with a glass of water, juice, soda, coffee, or tea.  Take if you go more than two days without a movement. Do not use MiraLax more than once per day. Call your doctor if you are still constipated or irregular after using this medication for 7 days in a row.  If you continue to have problems with postoperative constipation, please contact the office for further assistance and recommendations.  If you experience "the worst abdominal pain ever" or develop nausea or vomiting, please contact the office immediatly for further recommendations for treatment.  ITCHING  If you experience itching with your medications, try taking only a single pain pill, or even half a pain pill  at a time.  You can also use Benadryl over the counter for itching or also to help with sleep.   TED HOSE STOCKINGS Wear the elastic stockings on both legs for three weeks following surgery during the day but you may remove then at night for sleeping.  MEDICATIONS See your medication summary on the After Visit Summary that the nursing staff will review with you prior to discharge.  You may have some home medications which will be placed on hold until you complete the course of blood thinner medication.  It is important for you to complete the blood thinner medication as prescribed by your surgeon.  Continue your approved medications as instructed at time of discharge. Do not drive while taking narcotics.   PRECAUTIONS If you experience chest pain or shortness of breath - call 911 immediately for transfer to the hospital emergency department.  If you develop a fever greater that 101 F, purulent drainage from wound, increased redness or drainage from wound, foul odor from the wound/dressing, or calf pain - CONTACT YOUR SURGEON.  FOLLOW-UP APPOINTMENTS Make sure you keep all of your appointments after your operation with your surgeon and caregivers. You should call the office at [phone]  and make an appointment for approximately one week after the date of your surgery or on the date instructed by your surgeon outlined in the "After Visit Summary".  RANGE OF MOTION AND STRENGTHENING EXERCISES  Rehabilitation of the knee is important following a knee injury or an operation. After just a few days of immobilization, the muscles of the thigh which control the knee become weakened and shrink (atrophy). Knee exercises are designed to build up the tone and strength of the thigh muscles and to improve knee motion. Often times heat used for twenty to thirty minutes before working out will loosen up your tissues and help with improving the range of motion  but do not use heat for the first two weeks following surgery. These exercises can be done on a training (exercise) mat, on the floor, on a table or on a bed. Use what ever works the best and is most comfortable for you Knee exercises include:  QUAD STRENGTHENING EXERCISES Strengthening Quadriceps Sets  Tighten muscles on top of thigh by pushing knees down into floor or table. Hold for 20 seconds. Repeat 10 times. Do 2 sessions per day.     Strengthening Terminal Knee Extension  With knee bent over bolster, straighten knee by tightening muscle on top of thigh. Be sure to keep bottom of knee on bolster. Hold for 20 seconds. Repeat 10 times. Do 2 sessions per day.   Straight Leg with Bent Knee  Lie on back with opposite leg bent. Keep involved knee slightly bent at knee and raise leg 4-6". Hold for 10 seconds. Repeat 20 times per set. Do 2 sets per session. Do 2 sessions per day.  Post Anesthesia Home Care Instructions  Activity: Get plenty of rest for the remainder of the day. A responsible adult should stay with you for 24 hours following the procedure.  For the next 24 hours, DO NOT: -Drive a car -Paediatric nurse -Drink alcoholic beverages -Take any medication unless instructed by your physician -Make any legal decisions or sign important papers.  Meals: Start with liquid foods such as gelatin or soup. Progress to regular foods as tolerated. Avoid greasy, spicy, heavy foods. If nausea and/or vomiting occur, drink only clear liquids until the nausea and/or vomiting subsides. Call your physician if vomiting continues.  Special Instructions/Symptoms: Your throat may feel dry or sore from the anesthesia or the breathing tube placed in your throat during surgery. If this causes discomfort, gargle with warm salt water. The discomfort should disappear within 24 hours.  If you had a scopolamine patch placed behind your ear for the management of post- operative nausea and/or  vomiting:  1. The medication in the patch is effective for 72 hours, after which it should be removed.  Wrap patch in a tissue and discard in the trash. Wash hands thoroughly with soap and water. 2. You may remove the patch earlier than 72 hours if you experience unpleasant side effects which may include dry mouth, dizziness or visual disturbances. 3. Avoid touching the patch. Wash your hands with soap and water after contact with the patch.

## 2015-02-06 NOTE — Interval H&P Note (Signed)
History and Physical Interval Note:  02/06/2015 12:27 PM  Sheila Burke  has presented today for surgery, with the diagnosis of right knee lateral mensical tear  The various methods of treatment have been discussed with the patient and family. After consideration of risks, benefits and other options for treatment, the patient has consented to  Procedure(s): ARTHROSCOPY RIGHT KNEE WITH MENSICAL DEBRIDEMENT (Right) as a surgical intervention .  The patient's history has been reviewed, patient examined, no change in status, stable for surgery.  I have reviewed the patient's chart and labs.  Questions were answered to the patient's satisfaction.     Gearlean Alf

## 2015-02-06 NOTE — Anesthesia Postprocedure Evaluation (Signed)
  Anesthesia Post-op Note  Patient: Sheila Burke  Procedure(s) Performed: Procedure(s) (LRB): ARTHROSCOPY RIGHT KNEE WITH LATERAL MENSICAL DEBRIDEMENT (Right)  Patient Location: PACU  Anesthesia Type: General  Level of Consciousness: awake and alert   Airway and Oxygen Therapy: Patient Spontanous Breathing  Post-op Pain: mild  Post-op Assessment: Post-op Vital signs reviewed, Patient's Cardiovascular Status Stable, Respiratory Function Stable, Patent Airway and No signs of Nausea or vomiting  Last Vitals:  Filed Vitals:   02/06/15 1345  BP: 145/82  Pulse: 88  Temp:   Resp: 15    Post-op Vital Signs: stable   Complications: No apparent anesthesia complications

## 2015-02-06 NOTE — H&P (Signed)
  CC- Sheila Burke is a 69 y.o. female who presents with right knee pain.  HPI- . Knee Pain: Patient presents with knee pain involving the  right knee. Onset of the symptoms was several months ago. Inciting event: none known. Current symptoms include giving out, pain located laterally and stiffness. Pain is aggravated by going up and down stairs, pivoting, rising after sitting and walking.  Patient has had no prior knee problems. Evaluation to date: MRI: abnormal lateral meniscal tear. Treatment to date: corticosteroid injection which was not very effective.  Past Medical History  Diagnosis Date  . Right knee meniscal tear   . Mild exercise-induced asthma   . Vitamin D deficiency   . GERD (gastroesophageal reflux disease)   . Arthritis   . Depression   . Anxiety     Past Surgical History  Procedure Laterality Date  . Total hip arthroplasty Right 2012  . Nasal sinus surgery  2004  . Foot ganglion excision  x2   yrs ago  . Tubal ligation  ysr ago    Prior to Admission medications   Medication Sig Start Date End Date Taking? Authorizing Provider  ALBUTEROL IN Inhale into the lungs as needed.   Yes Historical Provider, MD  calcium carbonate (TUMS - DOSED IN MG ELEMENTAL CALCIUM) 500 MG chewable tablet Chew 1 tablet by mouth as needed for indigestion or heartburn.   Yes Historical Provider, MD  Cholecalciferol (VITAMIN D3) 2000 UNITS TABS Take 2 tablets by mouth every morning.   Yes Historical Provider, MD  diphenhydrAMINE (BENADRYL) 25 mg capsule Take 25 mg by mouth every 6 (six) hours as needed.   Yes Historical Provider, MD  docusate sodium (COLACE) 100 MG capsule Take 200 mg by mouth 2 (two) times daily.   Yes Historical Provider, MD  escitalopram (LEXAPRO) 5 MG tablet Take 5 mg by mouth every morning.   Yes Historical Provider, MD  venlafaxine XR (EFFEXOR-XR) 150 MG 24 hr capsule Take 150-300 mg by mouth daily with breakfast. ALTERNATES 150MG  ONE CAP AND 300MG  TAKES TWO CAP'S   Yes  Historical Provider, MD   KNEE EXAM antalgic gait, soft tissue tenderness over lateral joint line, no effusion, negative drawer sign, collateral ligaments intact  Physical Examination: General appearance - alert, well appearing, and in no distress Mental status - alert, oriented to person, place, and time Chest - clear to auscultation, no wheezes, rales or rhonchi, symmetric air entry Heart - normal rate, regular rhythm, normal S1, S2, no murmurs, rubs, clicks or gallops Abdomen - soft, nontender, nondistended, no masses or organomegaly Neurological - alert, oriented, normal speech, no focal findings or movement disorder noted    Asessment/Plan--- Rightt knee medial lateral tear- - Plan right knee arthroscopy with meniscal debridement. Procedure risks and potential comps discussed with patient who elects to proceed. Goals are decreased pain and increased function with a high likelihood of achieving both

## 2015-02-06 NOTE — Anesthesia Preprocedure Evaluation (Signed)
Anesthesia Evaluation  Patient identified by MRN, date of birth, ID band Patient awake    Reviewed: Allergy & Precautions, NPO status , Patient's Chart, lab work & pertinent test results  Airway Mallampati: I  TM Distance: >3 FB Neck ROM: Full    Dental   Pulmonary asthma ,    Pulmonary exam normal        Cardiovascular Normal cardiovascular exam     Neuro/Psych Anxiety Depression    GI/Hepatic GERD  Medicated and Controlled,  Endo/Other    Renal/GU      Musculoskeletal   Abdominal   Peds  Hematology   Anesthesia Other Findings   Reproductive/Obstetrics                             Anesthesia Physical Anesthesia Plan  ASA: II  Anesthesia Plan: General   Post-op Pain Management:    Induction: Intravenous  Airway Management Planned: LMA  Additional Equipment:   Intra-op Plan:   Post-operative Plan: Extubation in OR  Informed Consent: I have reviewed the patients History and Physical, chart, labs and discussed the procedure including the risks, benefits and alternatives for the proposed anesthesia with the patient or authorized representative who has indicated his/her understanding and acceptance.     Plan Discussed with: CRNA and Surgeon  Anesthesia Plan Comments:         Anesthesia Quick Evaluation

## 2015-02-06 NOTE — Op Note (Signed)
Preoperative diagnosis-  Right knee lateral meniscal tear  Postoperative diagnosis Right- knee lateral meniscal tear plus  Medial femoral chondral defect  Procedure- Right knee arthroscopy with lateral  meniscal debridement and chondroplasty   Surgeon- Dione Plover. Yuette Putnam, MD  Anesthesia-General  EBL-  Minimal  Complications- None  Condition- PACU - hemodynamically stable.  Brief clinical note- -Sheila Burke is a 69 y.o.  female with a several month history of right lateral leg and knee pain and mechanical symptoms. Exam and history suggested lateral meniscal tear confirmed by MRI. The patient presents now for arthroscopy and debridement   Procedure in detail -       After successful administration of General anesthetic, a tourmiquet is placed high on the Right  thigh and the Right lower extremity is prepped and draped in the usual sterile fashion. Time out is performed by the surgical team. Standard superomedial and inferolateral portal sites are marked and incisions made with an 11 blade. The inflow cannula is passed through the superomedial portal and camera through the inferolateral portal and inflow is initiated. Arthroscopic visualization proceeds.      The undersurface of the patella and trochlea are visualized and they are normal. The medial and lateral gutters are visualized and there are  no loose bodies. Flexion and valgus force is applied to the knee and the medial compartment is entered. A spinal needle is passed into the joint through the site marked for the inferomedial portal. A small incision is made and the dilator passed into the joint. The findings for the medial compartment are unstable 1 x 2 cm chondral defect medial femoral condyle. The shaver is used to debride the unstable cartilage to a stable cartilaginous base with stable edges. It is probed and found to be stable. The central aspect is debrided to bone, which is then abraded with the shaver.    The intercondylar  notch is visualized and the ACL appears normal. The lateral compartment is entered and the findings are unstable tear of body of lateral meniscus . The tear is debrided to a stable base with baskets and a shaver and sealed off with the Arthrocare.  It is probed and found to be stable. There are no chondral defects.     The joint is again inspected and there are no other tears, defects or loose bodies identified. The arthroscopic equipment is then removed from the inferior portals which are closed with interrupted 4-0 nylon. 20 ml of .25% Marcaine with epinephrine are injected through the inflow cannula and the cannula is then removed and the portal closed with nylon. The incisions are cleaned and dried and a bulky sterile dressing is applied. The patient is then awakened and transported to recovery in stable condition.   02/06/2015, 1:05 PM

## 2015-02-07 ENCOUNTER — Encounter (HOSPITAL_BASED_OUTPATIENT_CLINIC_OR_DEPARTMENT_OTHER): Payer: Self-pay | Admitting: Orthopedic Surgery

## 2015-02-14 DIAGNOSIS — Z4789 Encounter for other orthopedic aftercare: Secondary | ICD-10-CM | POA: Diagnosis not present

## 2015-02-27 ENCOUNTER — Ambulatory Visit
Admission: RE | Admit: 2015-02-27 | Discharge: 2015-02-27 | Disposition: A | Discharge status: Home / Self Care | Payer: Medicare Other | Source: Ambulatory Visit | Attending: Family Medicine | Admitting: Family Medicine

## 2015-02-27 DIAGNOSIS — M8588 Other specified disorders of bone density and structure, other site: Secondary | ICD-10-CM | POA: Diagnosis not present

## 2015-02-27 DIAGNOSIS — M8580 Other specified disorders of bone density and structure, unspecified site: Secondary | ICD-10-CM

## 2015-03-05 DIAGNOSIS — Z4789 Encounter for other orthopedic aftercare: Secondary | ICD-10-CM | POA: Diagnosis not present

## 2015-03-05 DIAGNOSIS — M23351 Other meniscus derangements, posterior horn of lateral meniscus, right knee: Secondary | ICD-10-CM | POA: Diagnosis not present

## 2015-03-18 ENCOUNTER — Other Ambulatory Visit: Payer: Self-pay | Admitting: Family Medicine

## 2015-03-18 ENCOUNTER — Ambulatory Visit
Admission: RE | Admit: 2015-03-18 | Discharge: 2015-03-18 | Disposition: A | Discharge status: Home / Self Care | Payer: Medicare Other | Source: Ambulatory Visit | Attending: Family Medicine | Admitting: Family Medicine

## 2015-03-18 DIAGNOSIS — K5900 Constipation, unspecified: Secondary | ICD-10-CM | POA: Diagnosis not present

## 2015-03-18 DIAGNOSIS — R1084 Generalized abdominal pain: Secondary | ICD-10-CM

## 2015-03-18 DIAGNOSIS — R109 Unspecified abdominal pain: Secondary | ICD-10-CM | POA: Diagnosis not present

## 2015-03-18 DIAGNOSIS — K5792 Diverticulitis of intestine, part unspecified, without perforation or abscess without bleeding: Secondary | ICD-10-CM | POA: Diagnosis not present

## 2015-03-21 DIAGNOSIS — K5792 Diverticulitis of intestine, part unspecified, without perforation or abscess without bleeding: Secondary | ICD-10-CM | POA: Diagnosis not present

## 2015-04-09 DIAGNOSIS — Z4789 Encounter for other orthopedic aftercare: Secondary | ICD-10-CM | POA: Diagnosis not present

## 2015-04-09 DIAGNOSIS — M23351 Other meniscus derangements, posterior horn of lateral meniscus, right knee: Secondary | ICD-10-CM | POA: Diagnosis not present

## 2015-04-11 ENCOUNTER — Other Ambulatory Visit: Payer: Self-pay

## 2015-04-11 DIAGNOSIS — F411 Generalized anxiety disorder: Secondary | ICD-10-CM | POA: Diagnosis not present

## 2015-04-11 DIAGNOSIS — Z0000 Encounter for general adult medical examination without abnormal findings: Secondary | ICD-10-CM | POA: Diagnosis not present

## 2015-04-11 DIAGNOSIS — Z1231 Encounter for screening mammogram for malignant neoplasm of breast: Secondary | ICD-10-CM

## 2015-04-11 DIAGNOSIS — K5792 Diverticulitis of intestine, part unspecified, without perforation or abscess without bleeding: Secondary | ICD-10-CM | POA: Diagnosis not present

## 2015-04-11 DIAGNOSIS — K900 Celiac disease: Secondary | ICD-10-CM | POA: Diagnosis not present

## 2015-04-11 DIAGNOSIS — G4700 Insomnia, unspecified: Secondary | ICD-10-CM | POA: Diagnosis not present

## 2015-04-22 DIAGNOSIS — K5792 Diverticulitis of intestine, part unspecified, without perforation or abscess without bleeding: Secondary | ICD-10-CM | POA: Diagnosis not present

## 2015-04-22 DIAGNOSIS — K5900 Constipation, unspecified: Secondary | ICD-10-CM | POA: Diagnosis not present

## 2015-04-22 DIAGNOSIS — T7840XS Allergy, unspecified, sequela: Secondary | ICD-10-CM | POA: Diagnosis not present

## 2015-04-24 DIAGNOSIS — B373 Candidiasis of vulva and vagina: Secondary | ICD-10-CM | POA: Diagnosis not present

## 2015-04-24 DIAGNOSIS — N760 Acute vaginitis: Secondary | ICD-10-CM | POA: Diagnosis not present

## 2015-05-21 DIAGNOSIS — R1032 Left lower quadrant pain: Secondary | ICD-10-CM | POA: Diagnosis not present

## 2015-05-28 ENCOUNTER — Ambulatory Visit: Payer: Medicare Other

## 2015-05-29 DIAGNOSIS — F411 Generalized anxiety disorder: Secondary | ICD-10-CM | POA: Diagnosis not present

## 2015-06-25 DIAGNOSIS — R1032 Left lower quadrant pain: Secondary | ICD-10-CM | POA: Diagnosis not present

## 2015-06-25 DIAGNOSIS — K5900 Constipation, unspecified: Secondary | ICD-10-CM | POA: Diagnosis not present

## 2015-06-26 ENCOUNTER — Other Ambulatory Visit: Payer: Self-pay | Admitting: Gastroenterology

## 2015-06-26 DIAGNOSIS — R1032 Left lower quadrant pain: Secondary | ICD-10-CM

## 2015-07-03 ENCOUNTER — Ambulatory Visit
Admission: RE | Admit: 2015-07-03 | Discharge: 2015-07-03 | Disposition: A | Discharge status: Home / Self Care | Payer: Medicare Other | Source: Ambulatory Visit | Attending: Gastroenterology | Admitting: Gastroenterology

## 2015-07-03 DIAGNOSIS — R1032 Left lower quadrant pain: Secondary | ICD-10-CM

## 2015-07-03 DIAGNOSIS — K5732 Diverticulitis of large intestine without perforation or abscess without bleeding: Secondary | ICD-10-CM | POA: Diagnosis not present

## 2015-07-03 MED ORDER — IOPAMIDOL (ISOVUE-300) INJECTION 61%
100.0000 mL | Freq: Once | INTRAVENOUS | Status: AC | PRN
  Administered 2015-07-03: 100 mL via INTRAVENOUS

## 2015-07-17 ENCOUNTER — Ambulatory Visit
Admission: RE | Admit: 2015-07-17 | Discharge: 2015-07-17 | Disposition: A | Discharge status: Home / Self Care | Payer: Medicare Other | Source: Ambulatory Visit

## 2015-07-17 DIAGNOSIS — Z1231 Encounter for screening mammogram for malignant neoplasm of breast: Secondary | ICD-10-CM

## 2015-07-23 DIAGNOSIS — F411 Generalized anxiety disorder: Secondary | ICD-10-CM | POA: Diagnosis not present

## 2015-08-01 DIAGNOSIS — R1032 Left lower quadrant pain: Secondary | ICD-10-CM | POA: Diagnosis not present

## 2015-08-01 DIAGNOSIS — K625 Hemorrhage of anus and rectum: Secondary | ICD-10-CM | POA: Diagnosis not present

## 2015-08-01 DIAGNOSIS — K641 Second degree hemorrhoids: Secondary | ICD-10-CM | POA: Diagnosis not present

## 2015-08-01 DIAGNOSIS — Z1211 Encounter for screening for malignant neoplasm of colon: Secondary | ICD-10-CM | POA: Diagnosis not present

## 2015-08-01 DIAGNOSIS — K5520 Angiodysplasia of colon without hemorrhage: Secondary | ICD-10-CM | POA: Diagnosis not present

## 2015-08-14 DIAGNOSIS — K641 Second degree hemorrhoids: Secondary | ICD-10-CM | POA: Diagnosis not present

## 2015-08-28 DIAGNOSIS — K641 Second degree hemorrhoids: Secondary | ICD-10-CM | POA: Diagnosis not present

## 2015-09-11 DIAGNOSIS — K641 Second degree hemorrhoids: Secondary | ICD-10-CM | POA: Diagnosis not present

## 2015-09-25 DIAGNOSIS — Z471 Aftercare following joint replacement surgery: Secondary | ICD-10-CM | POA: Diagnosis not present

## 2015-09-25 DIAGNOSIS — Z96641 Presence of right artificial hip joint: Secondary | ICD-10-CM | POA: Diagnosis not present

## 2015-09-25 DIAGNOSIS — M7062 Trochanteric bursitis, left hip: Secondary | ICD-10-CM | POA: Diagnosis not present

## 2015-10-28 DIAGNOSIS — F411 Generalized anxiety disorder: Secondary | ICD-10-CM | POA: Diagnosis not present

## 2015-12-10 DIAGNOSIS — F411 Generalized anxiety disorder: Secondary | ICD-10-CM | POA: Diagnosis not present

## 2015-12-31 DIAGNOSIS — L821 Other seborrheic keratosis: Secondary | ICD-10-CM | POA: Diagnosis not present

## 2015-12-31 DIAGNOSIS — D1800 Hemangioma unspecified site: Secondary | ICD-10-CM | POA: Diagnosis not present

## 2015-12-31 DIAGNOSIS — Z23 Encounter for immunization: Secondary | ICD-10-CM | POA: Diagnosis not present

## 2016-02-11 ENCOUNTER — Other Ambulatory Visit (HOSPITAL_COMMUNITY): Payer: Self-pay | Admitting: Psychiatry

## 2016-02-13 DIAGNOSIS — Z23 Encounter for immunization: Secondary | ICD-10-CM | POA: Diagnosis not present

## 2016-03-03 DIAGNOSIS — T283XXA Burn of internal genitourinary organs, initial encounter: Secondary | ICD-10-CM | POA: Diagnosis not present

## 2016-03-03 DIAGNOSIS — N952 Postmenopausal atrophic vaginitis: Secondary | ICD-10-CM | POA: Diagnosis not present

## 2016-03-03 DIAGNOSIS — R300 Dysuria: Secondary | ICD-10-CM | POA: Diagnosis not present

## 2016-03-03 DIAGNOSIS — N760 Acute vaginitis: Secondary | ICD-10-CM | POA: Diagnosis not present

## 2016-03-03 DIAGNOSIS — Z6823 Body mass index (BMI) 23.0-23.9, adult: Secondary | ICD-10-CM | POA: Diagnosis not present

## 2016-05-01 ENCOUNTER — Ambulatory Visit
Admission: RE | Admit: 2016-05-01 | Discharge: 2016-05-01 | Disposition: A | Discharge status: Home / Self Care | Payer: Medicare HMO | Source: Ambulatory Visit | Attending: Internal Medicine | Admitting: Internal Medicine

## 2016-05-01 ENCOUNTER — Other Ambulatory Visit: Payer: Self-pay | Admitting: Internal Medicine

## 2016-05-01 DIAGNOSIS — R0600 Dyspnea, unspecified: Secondary | ICD-10-CM

## 2016-05-01 DIAGNOSIS — R0609 Other forms of dyspnea: Secondary | ICD-10-CM | POA: Diagnosis not present

## 2016-05-01 DIAGNOSIS — R079 Chest pain, unspecified: Secondary | ICD-10-CM | POA: Diagnosis not present

## 2016-05-01 DIAGNOSIS — R5383 Other fatigue: Secondary | ICD-10-CM | POA: Diagnosis not present

## 2016-05-01 DIAGNOSIS — R0602 Shortness of breath: Secondary | ICD-10-CM | POA: Diagnosis not present

## 2016-05-01 DIAGNOSIS — R030 Elevated blood-pressure reading, without diagnosis of hypertension: Secondary | ICD-10-CM | POA: Diagnosis not present

## 2016-05-01 DIAGNOSIS — E7800 Pure hypercholesterolemia, unspecified: Secondary | ICD-10-CM | POA: Diagnosis not present

## 2016-05-06 ENCOUNTER — Ambulatory Visit (INDEPENDENT_AMBULATORY_CARE_PROVIDER_SITE_OTHER): Payer: Medicare HMO | Admitting: Cardiology

## 2016-05-06 ENCOUNTER — Ambulatory Visit (HOSPITAL_COMMUNITY): Payer: Medicare HMO | Attending: Cardiology

## 2016-05-06 ENCOUNTER — Encounter: Payer: Self-pay | Admitting: Cardiology

## 2016-05-06 ENCOUNTER — Other Ambulatory Visit: Payer: Self-pay

## 2016-05-06 VITALS — BP 153/85 | HR 73 | Ht 64.0 in | Wt 137.6 lb

## 2016-05-06 DIAGNOSIS — R030 Elevated blood-pressure reading, without diagnosis of hypertension: Secondary | ICD-10-CM | POA: Diagnosis not present

## 2016-05-06 DIAGNOSIS — R072 Precordial pain: Secondary | ICD-10-CM | POA: Insufficient documentation

## 2016-05-06 DIAGNOSIS — E7800 Pure hypercholesterolemia, unspecified: Secondary | ICD-10-CM | POA: Diagnosis not present

## 2016-05-06 DIAGNOSIS — I340 Nonrheumatic mitral (valve) insufficiency: Secondary | ICD-10-CM | POA: Diagnosis not present

## 2016-05-06 LAB — ECHOCARDIOGRAM COMPLETE
Height: 64 in
Weight: 2201.6 oz

## 2016-05-06 NOTE — Patient Instructions (Signed)
Medication Instructions:   NO CHANGE  Labwork:  Your physician recommends that you HAVE LAB WORK TODAY  Testing/Procedures:  Your physician has requested that you have an echocardiogram. Echocardiography is a painless test that uses sound waves to create images of your heart. It provides your doctor with information about the size and shape of your heart and how well your heart's chambers and valves are working. This procedure takes approximately one hour. There are no restrictions for this procedure.   Your physician has requested that you have an exercise tolerance test. For further information please visit [email or web address]. Please also follow instruction sheet, as given.    Follow-Up:  Your physician recommends that you schedule a follow-up appointment in: 8 WEEKS WITH DR Stanford Breed    Exercise Stress Electrocardiogram An exercise stress electrocardiogram is a test to check how blood flows to your heart. It is done to find areas of poor blood flow. You will need to walk on a treadmill for this test. The electrocardiogram will record your heartbeat when you are at rest and when you are exercising. What happens before the procedure?  Do not have drinks with caffeine or foods with caffeine for 24 hours before the test, or as told by your doctor. This includes coffee, tea (even decaf tea), sodas, chocolate, and cocoa.  Follow your doctor's instructions about eating and drinking before the test.  Ask your doctor what medicines you should or should not take before the test. Take your medicines with water unless told by your doctor not to.  If you use an inhaler, bring it with you to the test.  Bring a snack to eat after the test.  Do not  smoke for 4 hours before the test.  Do not put lotions, powders, creams, or oils on your chest before the test.  Wear comfortable shoes and clothing. What happens during the procedure?  You will have patches put on your chest. Small areas of  your chest may need to be shaved. Wires will be connected to the patches.  Your heart rate will be watched while you are resting and while you are exercising.  You will walk on the treadmill. The treadmill will slowly get faster to raise your heart rate.  The test will take about 1-2 hours. What happens after the procedure?  Your heart rate and blood pressure will be watched after the test.  You may return to your normal diet, activities, and medicines or as told by your doctor. This information is not intended to replace advice given to you by your health care provider. Make sure you discuss any questions you have with your health care provider. Document Released: 09/02/2007 Document Revised: 11/13/2015 Document Reviewed: 11/21/2012 Elsevier Interactive Patient Education  2017 [employer].

## 2016-05-06 NOTE — Progress Notes (Signed)
HPI: 71 year old female for evaluation of chest pain. Chest x-ray February 2018 showed no acute disease. Patient recently traveled to [place]. While there she developed fevers and chills in late December. She also developed a pain in the substernal area without radiation described as a great Dane sitting on her chest. No radiation. Some increase with inspiration. No associated symptoms. The pain lasted most of the day and then resolved. Since returning home she has had recurrence of the symptoms. They occur both with exertion and at rest. There is some increase with lying flat. She denies fevers, chills but she has noticed increased dyspnea on exertion. No pedal edema. Because of the above we were asked to evaluate.  Current Outpatient Prescriptions  Medication Sig Dispense Refill  . aspirin EC 81 MG tablet Take 81 mg by mouth daily.    . clonazePAM (KLONOPIN) 0.5 MG tablet Take 1 tablet by mouth as needed.    . diphenhydrAMINE (BENADRYL) 25 mg capsule Take 25 mg by mouth every 6 (six) hours as needed.    Marland Kitchen ESTRACE VAGINAL 0.1 MG/GM vaginal cream USE 1 GRAM VAGINALLY D FOR 2 WEEKS AND THEN ONCE OR TWICE A WEEK  2  . nitroGLYCERIN (NITROSTAT) 0.4 MG SL tablet Place 1 tablet under the tongue as needed.    . venlafaxine XR (EFFEXOR XR) 150 MG 24 hr capsule Take 150 mg by mouth daily with breakfast.     No current facility-administered medications for this visit.     Allergies  Allergen Reactions  . Bee Venom Anaphylaxis  . Mobic [Meloxicam] Shortness Of Breath  . Sulfa Antibiotics Shortness Of Breath  . Other Other (See Comments)    TOMATOES, CHOCOLATE, DAIRY, MSG (HEAHACHE), PEACH AND CHERRY (IRRITATED THROAT), NUTS     Past Medical History:  Diagnosis Date  . Anxiety   . Arthritis   . Depression   . GERD (gastroesophageal reflux disease)   . Hyperlipidemia   . Mild exercise-induced asthma   . Right knee meniscal tear   . Vitamin D deficiency     Past Surgical History:    Procedure Laterality Date  . FOOT GANGLION EXCISION  x2   yrs ago  . KNEE ARTHROSCOPY Right 02/06/2015   Procedure: ARTHROSCOPY RIGHT KNEE WITH LATERAL MENSICAL DEBRIDEMENT;  Surgeon: Gaynelle Arabian, MD;  Location: [employer];  Service: Orthopedics;  Laterality: Right;  . NASAL SINUS SURGERY  2004  . TOTAL HIP ARTHROPLASTY Right 2012  . TUBAL LIGATION  ysr ago    Social History   Social History  . Marital status: Divorced    Spouse name: N/A  . Number of children: 2  . Years of education: N/A   Occupational History  . Not on file.   Social History Main Topics  . Smoking status: Never Smoker  . Smokeless tobacco: Never Used  . Alcohol use 8.4 oz/week    7 Glasses of wine, 7 Shots of liquor per week     Comment: daily wine and drink  . Drug use: No  . Sexual activity: Not on file   Other Topics Concern  . Not on file   Social History Narrative  . No narrative on file    Family History  Problem Relation Age of Onset  . Hypertension Mother   . Depression Father     ROS: no fevers or chills, productive cough, hemoptysis, dysphasia, odynophagia, melena, hematochezia, dysuria, hematuria, rash, seizure activity, orthopnea, PND, pedal edema, claudication. Remaining systems are  negative.  Physical Exam:   Blood pressure (!) 153/85, pulse 73, height 5\' 4"  (1.626 m), weight 137 lb 9.6 oz (62.4 kg).  General:  Well developed/well nourished in NAD Skin warm/dry Patient not depressed No peripheral clubbing Back-normal HEENT-normal/normal eyelids Neck supple/normal carotid upstroke bilaterally; no JVD; no thyromegaly chest - CTA/ normal expansion CV - RRR/normal S1 and S2; no murmurs, rubs or gallops;  PMI nondisplaced Abdomen -NT/ND, no HSM, no mass, + bowel sounds, no bruit 2+ femoral pulses, no bruits Ext-no edema, no chords, 2+ DP Neuro-grossly nonfocal  ECG -Sinus rhythm at a rate of 73. Normal axis. No significant ST changes.  A/P  1 chest  pain-given recent travel to [place] and some dyspnea on exertion I think we must exclude pulmonary embolus although I think unlikely. I will schedule a d-dimer and if elevated she will need a CTA of her chest. She also had fevers in late December and there some increase with inspiration and lying flat. Electrocardiogram not consistent with pericarditis but will check echocardiogram to exclude effusion and wall motion analysis. Finally I will arrange an exercise treadmill for risk stratification.  2 hyperlipidemia-management per primary care.  3 elevated blood pressure-without diagnosis of hypertension-she states this typically is normal. We will follow and add medications if needed.  Kirk Ruths, MD

## 2016-05-07 ENCOUNTER — Telehealth: Payer: Self-pay | Admitting: Cardiology

## 2016-05-07 ENCOUNTER — Other Ambulatory Visit: Payer: Self-pay | Admitting: *Deleted

## 2016-05-07 ENCOUNTER — Other Ambulatory Visit: Payer: Medicare HMO | Admitting: *Deleted

## 2016-05-07 DIAGNOSIS — R0609 Other forms of dyspnea: Secondary | ICD-10-CM

## 2016-05-07 DIAGNOSIS — Z01812 Encounter for preprocedural laboratory examination: Secondary | ICD-10-CM

## 2016-05-07 DIAGNOSIS — Z79899 Other long term (current) drug therapy: Secondary | ICD-10-CM

## 2016-05-07 DIAGNOSIS — R7989 Other specified abnormal findings of blood chemistry: Secondary | ICD-10-CM

## 2016-05-07 DIAGNOSIS — Z789 Other specified health status: Secondary | ICD-10-CM

## 2016-05-07 DIAGNOSIS — R0600 Dyspnea, unspecified: Secondary | ICD-10-CM

## 2016-05-07 LAB — BASIC METABOLIC PANEL
BUN / CREAT RATIO: 20 (ref 12–28)
BUN: 17 mg/dL (ref 8–27)
CALCIUM: 9.7 mg/dL (ref 8.7–10.3)
CO2: 30 mmol/L — ABNORMAL HIGH (ref 18–29)
CREATININE: 0.85 mg/dL (ref 0.57–1.00)
Chloride: 102 mmol/L (ref 96–106)
GFR, EST AFRICAN AMERICAN: 79 mL/min/{1.73_m2} (ref >59)
GFR, EST NON AFRICAN AMERICAN: 69 mL/min/{1.73_m2} (ref >59)
Glucose: 113 mg/dL — ABNORMAL HIGH (ref 65–99)
Potassium: 4 mmol/L (ref 3.5–5.2)
Sodium: 138 mmol/L (ref 134–144)

## 2016-05-07 LAB — D-DIMER, QUANTITATIVE: D-Dimer, Quant: 0.54 mcg/mL FEU — ABNORMAL HIGH (ref <0.50)

## 2016-05-07 NOTE — Telephone Encounter (Signed)
Patient notified of echo & D-Dimer results. She is aware that Dr. Stanford Breed has ordered further testing given that her D-Dimer is elevated. She wishes to proceed with CT angio testing asap. Test ordered and staff message sent to scheduling pool to contact patient to arrange study.

## 2016-05-07 NOTE — Telephone Encounter (Signed)
Pt would like her echo results from yesterday please. °

## 2016-05-08 ENCOUNTER — Telehealth (HOSPITAL_COMMUNITY): Payer: Self-pay

## 2016-05-08 ENCOUNTER — Encounter: Payer: Self-pay | Admitting: Cardiology

## 2016-05-08 ENCOUNTER — Ambulatory Visit (INDEPENDENT_AMBULATORY_CARE_PROVIDER_SITE_OTHER)
Admission: RE | Admit: 2016-05-08 | Discharge: 2016-05-08 | Disposition: A | Discharge status: Home / Self Care | Payer: Medicare HMO | Source: Ambulatory Visit | Attending: Cardiology | Admitting: Cardiology

## 2016-05-08 DIAGNOSIS — Z789 Other specified health status: Secondary | ICD-10-CM | POA: Diagnosis not present

## 2016-05-08 DIAGNOSIS — R0609 Other forms of dyspnea: Secondary | ICD-10-CM | POA: Diagnosis not present

## 2016-05-08 DIAGNOSIS — R7989 Other specified abnormal findings of blood chemistry: Secondary | ICD-10-CM | POA: Diagnosis not present

## 2016-05-08 DIAGNOSIS — R0600 Dyspnea, unspecified: Secondary | ICD-10-CM

## 2016-05-08 MED ORDER — IOPAMIDOL (ISOVUE-370) INJECTION 76%
80.0000 mL | Freq: Once | INTRAVENOUS | Status: AC | PRN
  Administered 2016-05-08: 80 mL via INTRAVENOUS

## 2016-05-08 NOTE — Telephone Encounter (Signed)
This encounter was created in error - please disregard.

## 2016-05-08 NOTE — Telephone Encounter (Signed)
New Message  Pt voiced returning call about CT she had this morning.  Please f/u

## 2016-05-08 NOTE — Telephone Encounter (Signed)
Encounter complete. 

## 2016-05-11 ENCOUNTER — Telehealth: Payer: Self-pay | Admitting: Cardiology

## 2016-05-11 NOTE — Telephone Encounter (Signed)
New message      Pt c/o of Chest Pain: 1. Are you having CP right now? Yes---she has this pain almost all day.  Pt states that she had this pain in feb and Dr Stanford Breed is aware 2. Are you experiencing any other symptoms (ex. SOB, nausea, vomiting, sweating)?  no 3. How long have you been experiencing CP?  2 weeks 4. Is your CP continuous or coming and going?  Comes and go 5. Have you taken Nitroglycerin?  Took a nitro last week

## 2016-05-11 NOTE — Telephone Encounter (Signed)
Spoke with pt, she reports a continuous aching/pressure pain in her chest, up into neck and occ down the left arm for 2 weeks now. She was seen by dr Stanford Breed and is due for GXT Wednesday this week. She is anxious about this discomfort esp since she took a NTG and it made the squeezing better, it did not take it completely away. She reports SOB on long walks and an increase in the squeezing feeling when walking or chasing her dog. She also reports an increase in her blood pressure of 127/85 to 169/93. She is aware of the GXT appointment but wanted to make sure we knew what was going on because it is beginning to scare her. Will discuss with dr Stanford Breed

## 2016-05-11 NOTE — Telephone Encounter (Signed)
Discussed with dr Stanford Breed, patient to go ahead with the GXT on Wednesday this week. Patient voiced understanding to go to the ER for evaluation prior to GXT if she needs to.

## 2016-05-12 DIAGNOSIS — R69 Illness, unspecified: Secondary | ICD-10-CM | POA: Diagnosis not present

## 2016-05-12 DIAGNOSIS — F411 Generalized anxiety disorder: Secondary | ICD-10-CM | POA: Diagnosis not present

## 2016-05-13 ENCOUNTER — Ambulatory Visit (HOSPITAL_COMMUNITY)
Admission: RE | Admit: 2016-05-13 | Discharge: 2016-05-13 | Disposition: A | Discharge status: Home / Self Care | Payer: Medicare HMO | Source: Ambulatory Visit | Attending: Internal Medicine | Admitting: Internal Medicine

## 2016-05-13 DIAGNOSIS — R072 Precordial pain: Secondary | ICD-10-CM | POA: Diagnosis not present

## 2016-05-13 LAB — EXERCISE TOLERANCE TEST
CHL CUP MPHR: 148 {beats}/min
CHL CUP RESTING HR STRESS: 71 {beats}/min
CHL RATE OF PERCEIVED EXERTION: 18
CSEPEW: 7 METS
Exercise duration (min): 5 min
Exercise duration (sec): 0 s
Peak HR: 148 {beats}/min
Percent HR: 100 %

## 2016-05-14 ENCOUNTER — Telehealth: Payer: Self-pay | Admitting: Cardiology

## 2016-05-14 NOTE — Telephone Encounter (Signed)
Spoke with pt, she continues to have the same pain as before and now it goes up into her neck and down her left arm. She would like a cardiac cath for a definite answer. She reports this is debilitating for her. She would like the cath set up asap. Will forward for dr Stanford Breed review and advise

## 2016-05-14 NOTE — Telephone Encounter (Signed)
Spoke with pt, aware will call once reviewed by dr Stanford Breed

## 2016-05-14 NOTE — Telephone Encounter (Signed)
Pa eval to arrange cath Kirk Ruths

## 2016-05-14 NOTE — Telephone Encounter (Signed)
New message      Patient states she has completed all testing and want to know what is next regarding her condition.  Please call

## 2016-05-14 NOTE — Telephone Encounter (Signed)
Follow up    Pt calling to follow up with Debra.

## 2016-05-18 ENCOUNTER — Other Ambulatory Visit: Payer: Self-pay | Admitting: *Deleted

## 2016-05-18 DIAGNOSIS — R072 Precordial pain: Secondary | ICD-10-CM

## 2016-05-18 NOTE — Telephone Encounter (Signed)
Discussed with dr Stanford Breed, patient would like to proceed with cath without being seen. Cath scheduled 05/18/16 @ 10:30 AM. She will arrive at the hospital at 8 am for protime. Other labs have been completed. Instructions discussed in detail with the patient.

## 2016-05-19 ENCOUNTER — Encounter (HOSPITAL_COMMUNITY)
Admission: RE | Disposition: A | Discharge status: Home / Self Care | Payer: Self-pay | Source: Ambulatory Visit | Attending: Interventional Cardiology

## 2016-05-19 ENCOUNTER — Ambulatory Visit (HOSPITAL_COMMUNITY)
Admission: RE | Admit: 2016-05-19 | Discharge: 2016-05-19 | Disposition: A | Discharge status: Home / Self Care | Payer: Medicare HMO | Source: Ambulatory Visit | Attending: Interventional Cardiology | Admitting: Interventional Cardiology

## 2016-05-19 DIAGNOSIS — R69 Illness, unspecified: Secondary | ICD-10-CM | POA: Diagnosis not present

## 2016-05-19 DIAGNOSIS — E785 Hyperlipidemia, unspecified: Secondary | ICD-10-CM | POA: Diagnosis not present

## 2016-05-19 DIAGNOSIS — K219 Gastro-esophageal reflux disease without esophagitis: Secondary | ICD-10-CM | POA: Diagnosis not present

## 2016-05-19 DIAGNOSIS — M1990 Unspecified osteoarthritis, unspecified site: Secondary | ICD-10-CM | POA: Insufficient documentation

## 2016-05-19 DIAGNOSIS — E559 Vitamin D deficiency, unspecified: Secondary | ICD-10-CM | POA: Insufficient documentation

## 2016-05-19 DIAGNOSIS — R030 Elevated blood-pressure reading, without diagnosis of hypertension: Secondary | ICD-10-CM | POA: Diagnosis not present

## 2016-05-19 DIAGNOSIS — F329 Major depressive disorder, single episode, unspecified: Secondary | ICD-10-CM | POA: Diagnosis not present

## 2016-05-19 DIAGNOSIS — Z8249 Family history of ischemic heart disease and other diseases of the circulatory system: Secondary | ICD-10-CM | POA: Insufficient documentation

## 2016-05-19 DIAGNOSIS — J45990 Exercise induced bronchospasm: Secondary | ICD-10-CM | POA: Insufficient documentation

## 2016-05-19 DIAGNOSIS — F419 Anxiety disorder, unspecified: Secondary | ICD-10-CM | POA: Insufficient documentation

## 2016-05-19 DIAGNOSIS — Z882 Allergy status to sulfonamides status: Secondary | ICD-10-CM | POA: Insufficient documentation

## 2016-05-19 DIAGNOSIS — I2510 Atherosclerotic heart disease of native coronary artery without angina pectoris: Secondary | ICD-10-CM

## 2016-05-19 DIAGNOSIS — R072 Precordial pain: Secondary | ICD-10-CM | POA: Insufficient documentation

## 2016-05-19 DIAGNOSIS — Z7982 Long term (current) use of aspirin: Secondary | ICD-10-CM | POA: Insufficient documentation

## 2016-05-19 HISTORY — PX: LEFT HEART CATH AND CORONARY ANGIOGRAPHY: CATH118249

## 2016-05-19 LAB — CBC
HEMATOCRIT: 43.4 % (ref 36.0–46.0)
HEMOGLOBIN: 14.7 g/dL (ref 12.0–15.0)
MCH: 33.3 pg (ref 26.0–34.0)
MCHC: 33.9 g/dL (ref 30.0–36.0)
MCV: 98.2 fL (ref 78.0–100.0)
Platelets: 340 10*3/uL (ref 150–400)
RBC: 4.42 MIL/uL (ref 3.87–5.11)
RDW: 13.2 % (ref 11.5–15.5)
WBC: 4.9 10*3/uL (ref 4.0–10.5)

## 2016-05-19 LAB — PROTIME-INR
INR: 0.86
Prothrombin Time: 11.7 seconds (ref 11.4–15.2)

## 2016-05-19 SURGERY — LEFT HEART CATH AND CORONARY ANGIOGRAPHY

## 2016-05-19 MED ORDER — HEPARIN SODIUM (PORCINE) 1000 UNIT/ML IJ SOLN
INTRAMUSCULAR | Status: AC
  Filled 2016-05-19: qty 1

## 2016-05-19 MED ORDER — SODIUM CHLORIDE 0.9 % IV SOLN: INTRAVENOUS | Status: AC

## 2016-05-19 MED ORDER — IOPAMIDOL (ISOVUE-370) INJECTION 76%
INTRAVENOUS | Status: DC | PRN
  Administered 2016-05-19: 35 mL via INTRA_ARTERIAL

## 2016-05-19 MED ORDER — LIDOCAINE HCL (PF) 1 % IJ SOLN
INTRAMUSCULAR | Status: AC
Start: 2016-05-19 — End: ?
  Filled 2016-05-19: qty 30

## 2016-05-19 MED ORDER — SODIUM CHLORIDE 0.9 % WEIGHT BASED INFUSION: 1.0000 mL/kg/h | INTRAVENOUS | Status: DC

## 2016-05-19 MED ORDER — MIDAZOLAM HCL 2 MG/2ML IJ SOLN
INTRAMUSCULAR | Status: AC
  Filled 2016-05-19: qty 2

## 2016-05-19 MED ORDER — MIDAZOLAM HCL 2 MG/2ML IJ SOLN
INTRAMUSCULAR | Status: DC | PRN
  Administered 2016-05-19 (×2): 1 mg via INTRAVENOUS
  Administered 2016-05-19: 2 mg via INTRAVENOUS

## 2016-05-19 MED ORDER — SODIUM CHLORIDE 0.9% FLUSH: 3.0000 mL | INTRAVENOUS | Status: DC | PRN

## 2016-05-19 MED ORDER — NITROGLYCERIN 0.4 MG SL SUBL
0.4000 mg | SUBLINGUAL_TABLET | SUBLINGUAL | Status: DC | PRN
  Administered 2016-05-19: 0.4 mg via SUBLINGUAL

## 2016-05-19 MED ORDER — NITROGLYCERIN 1 MG/10 ML FOR IR/CATH LAB
INTRA_ARTERIAL | Status: AC
  Filled 2016-05-19: qty 10

## 2016-05-19 MED ORDER — NITROGLYCERIN 0.4 MG SL SUBL
SUBLINGUAL_TABLET | SUBLINGUAL | Status: AC
  Administered 2016-05-19: 0.4 mg via SUBLINGUAL
  Filled 2016-05-19: qty 1

## 2016-05-19 MED ORDER — LIDOCAINE HCL (PF) 1 % IJ SOLN
INTRAMUSCULAR | Status: DC | PRN
  Administered 2016-05-19: 2 mL

## 2016-05-19 MED ORDER — HEPARIN (PORCINE) IN NACL 2-0.9 UNIT/ML-% IJ SOLN
INTRAMUSCULAR | Status: DC | PRN
  Administered 2016-05-19: 1500 mL

## 2016-05-19 MED ORDER — VERAPAMIL HCL 2.5 MG/ML IV SOLN
INTRAVENOUS | Status: AC
  Filled 2016-05-19: qty 2

## 2016-05-19 MED ORDER — SODIUM CHLORIDE 0.9 % IV SOLN: 250.0000 mL | INTRAVENOUS | Status: DC | PRN

## 2016-05-19 MED ORDER — NITROGLYCERIN 1 MG/10 ML FOR IR/CATH LAB
INTRA_ARTERIAL | Status: DC | PRN
  Administered 2016-05-19: 200 ug via INTRA_ARTERIAL

## 2016-05-19 MED ORDER — VERAPAMIL HCL 2.5 MG/ML IV SOLN
INTRAVENOUS | Status: DC | PRN
  Administered 2016-05-19: 12:00:00 via INTRA_ARTERIAL

## 2016-05-19 MED ORDER — FENTANYL CITRATE (PF) 100 MCG/2ML IJ SOLN
INTRAMUSCULAR | Status: AC
  Filled 2016-05-19: qty 2

## 2016-05-19 MED ORDER — HEPARIN (PORCINE) IN NACL 2-0.9 UNIT/ML-% IJ SOLN
INTRAMUSCULAR | Status: AC
  Filled 2016-05-19: qty 1500

## 2016-05-19 MED ORDER — ASPIRIN 81 MG PO CHEW
CHEWABLE_TABLET | ORAL | Status: AC
  Administered 2016-05-19: 81 mg via ORAL
  Filled 2016-05-19: qty 1

## 2016-05-19 MED ORDER — SODIUM CHLORIDE 0.9% FLUSH: 3.0000 mL | Freq: Two times a day (BID) | INTRAVENOUS | Status: DC

## 2016-05-19 MED ORDER — SODIUM CHLORIDE 0.9 % IV SOLN: INTRAVENOUS | Status: DC

## 2016-05-19 MED ORDER — SODIUM CHLORIDE 0.9 % WEIGHT BASED INFUSION
3.0000 mL/kg/h | INTRAVENOUS | Status: AC
  Administered 2016-05-19: 3 mL/kg/h via INTRAVENOUS

## 2016-05-19 MED ORDER — HEPARIN SODIUM (PORCINE) 1000 UNIT/ML IJ SOLN
INTRAMUSCULAR | Status: DC | PRN
  Administered 2016-05-19: 3000 [IU] via INTRAVENOUS

## 2016-05-19 MED ORDER — IOPAMIDOL (ISOVUE-370) INJECTION 76%
INTRAVENOUS | Status: AC
  Filled 2016-05-19: qty 100

## 2016-05-19 MED ORDER — FENTANYL CITRATE (PF) 100 MCG/2ML IJ SOLN
INTRAMUSCULAR | Status: DC | PRN
  Administered 2016-05-19: 50 ug via INTRAVENOUS
  Administered 2016-05-19 (×2): 25 ug via INTRAVENOUS

## 2016-05-19 MED ORDER — ASPIRIN 81 MG PO CHEW
81.0000 mg | CHEWABLE_TABLET | ORAL | Status: AC
  Administered 2016-05-19: 81 mg via ORAL

## 2016-05-19 SURGICAL SUPPLY — 11 items
CATH INFINITI 5 FR JL3.5 (CATHETERS) ×1 IMPLANT
CATH INFINITI JR4 5F (CATHETERS) ×1 IMPLANT
DEVICE RAD COMP TR BAND LRG (VASCULAR PRODUCTS) IMPLANT
DEVICE RAD TR BAND REGULAR (VASCULAR PRODUCTS) ×1 IMPLANT
GLIDESHEATH SLEND SS 6F .021 (SHEATH) ×1 IMPLANT
GUIDEWIRE INQWIRE 1.5J.035X260 (WIRE) IMPLANT
INQWIRE 1.5J .035X260CM (WIRE) ×2
KIT HEART LEFT (KITS) ×2 IMPLANT
PACK CARDIAC CATHETERIZATION (CUSTOM PROCEDURE TRAY) ×2 IMPLANT
TRANSDUCER W/STOPCOCK (MISCELLANEOUS) ×2 IMPLANT
TUBING CIL FLEX 10 FLL-RA (TUBING) ×2 IMPLANT

## 2016-05-19 NOTE — H&P (View-Only) (Signed)
HPI: 71 year old female for evaluation of chest pain. Chest x-ray February 2018 showed no acute disease. Patient recently traveled to [place]. While there she developed fevers and chills in late December. She also developed a pain in the substernal area without radiation described as a great Dane sitting on her chest. No radiation. Some increase with inspiration. No associated symptoms. The pain lasted most of the day and then resolved. Since returning home she has had recurrence of the symptoms. They occur both with exertion and at rest. There is some increase with lying flat. She denies fevers, chills but she has noticed increased dyspnea on exertion. No pedal edema. Because of the above we were asked to evaluate.  Current Outpatient Prescriptions  Medication Sig Dispense Refill  . aspirin EC 81 MG tablet Take 81 mg by mouth daily.    . clonazePAM (KLONOPIN) 0.5 MG tablet Take 1 tablet by mouth as needed.    . diphenhydrAMINE (BENADRYL) 25 mg capsule Take 25 mg by mouth every 6 (six) hours as needed.    Marland Kitchen ESTRACE VAGINAL 0.1 MG/GM vaginal cream USE 1 GRAM VAGINALLY D FOR 2 WEEKS AND THEN ONCE OR TWICE A WEEK  2  . nitroGLYCERIN (NITROSTAT) 0.4 MG SL tablet Place 1 tablet under the tongue as needed.    . venlafaxine XR (EFFEXOR XR) 150 MG 24 hr capsule Take 150 mg by mouth daily with breakfast.     No current facility-administered medications for this visit.     Allergies  Allergen Reactions  . Bee Venom Anaphylaxis  . Mobic [Meloxicam] Shortness Of Breath  . Sulfa Antibiotics Shortness Of Breath  . Other Other (See Comments)    TOMATOES, CHOCOLATE, DAIRY, MSG (HEAHACHE), PEACH AND CHERRY (IRRITATED THROAT), NUTS     Past Medical History:  Diagnosis Date  . Anxiety   . Arthritis   . Depression   . GERD (gastroesophageal reflux disease)   . Hyperlipidemia   . Mild exercise-induced asthma   . Right knee meniscal tear   . Vitamin D deficiency     Past Surgical History:    Procedure Laterality Date  . FOOT GANGLION EXCISION  x2   yrs ago  . KNEE ARTHROSCOPY Right 02/06/2015   Procedure: ARTHROSCOPY RIGHT KNEE WITH LATERAL MENSICAL DEBRIDEMENT;  Surgeon: Gaynelle Arabian, MD;  Location: [facility];  Service: Orthopedics;  Laterality: Right;  . NASAL SINUS SURGERY  2004  . TOTAL HIP ARTHROPLASTY Right 2012  . TUBAL LIGATION  ysr ago    Social History   Social History  . Marital status: Divorced    Spouse name: N/A  . Number of children: 2  . Years of education: N/A   Occupational History  . Not on file.   Social History Main Topics  . Smoking status: Never Smoker  . Smokeless tobacco: Never Used  . Alcohol use 8.4 oz/week    7 Glasses of wine, 7 Shots of liquor per week     Comment: daily wine and drink  . Drug use: No  . Sexual activity: Not on file   Other Topics Concern  . Not on file   Social History Narrative  . No narrative on file    Family History  Problem Relation Age of Onset  . Hypertension Mother   . Depression Father     ROS: no fevers or chills, productive cough, hemoptysis, dysphasia, odynophagia, melena, hematochezia, dysuria, hematuria, rash, seizure activity, orthopnea, PND, pedal edema, claudication. Remaining systems are  negative.  Physical Exam:   Blood pressure (!) 153/85, pulse 73, height 5\' 4"  (1.626 m), weight 137 lb 9.6 oz (62.4 kg).  General:  Well developed/well nourished in NAD Skin warm/dry Patient not depressed No peripheral clubbing Back-normal HEENT-normal/normal eyelids Neck supple/normal carotid upstroke bilaterally; no JVD; no thyromegaly chest - CTA/ normal expansion CV - RRR/normal S1 and S2; no murmurs, rubs or gallops;  PMI nondisplaced Abdomen -NT/ND, no HSM, no mass, + bowel sounds, no bruit 2+ femoral pulses, no bruits Ext-no edema, no chords, 2+ DP Neuro-grossly nonfocal  ECG -Sinus rhythm at a rate of 73. Normal axis. No significant ST changes.  A/P  1 chest  pain-given recent travel to [place] and some dyspnea on exertion I think we must exclude pulmonary embolus although I think unlikely. I will schedule a d-dimer and if elevated she will need a CTA of her chest. She also had fevers in late December and there some increase with inspiration and lying flat. Electrocardiogram not consistent with pericarditis but will check echocardiogram to exclude effusion and wall motion analysis. Finally I will arrange an exercise treadmill for risk stratification.  2 hyperlipidemia-management per primary care.  3 elevated blood pressure-without diagnosis of hypertension-she states this typically is normal. We will follow and add medications if needed.  Kirk Ruths, MD

## 2016-05-19 NOTE — Discharge Instructions (Signed)

## 2016-05-19 NOTE — Interval H&P Note (Signed)
Cath Lab Visit (complete for each Cath Lab visit)  Clinical Evaluation Leading to the Procedure:   ACS: No.  Non-ACS:    Anginal Classification: CCS III  Anti-ischemic medical therapy: Minimal Therapy (1 class of medications)  Non-Invasive Test Results: Low-risk stress test findings: cardiac mortality <1%/year  Prior CABG: No previous CABG      History and Physical Interval Note:  05/19/2016 11:25 AM  Sheila Burke  has presented today for surgery, with the diagnosis of cp  The various methods of treatment have been discussed with the patient and family. After consideration of risks, benefits and other options for treatment, the patient has consented to  Procedure(s): Left Heart Cath and Coronary Angiography (N/A) as a surgical intervention .  The patient's history has been reviewed, patient examined, no change in status, stable for surgery.  I have reviewed the patient's chart and labs.  Questions were answered to the patient's satisfaction.     Larae Grooms

## 2016-05-20 ENCOUNTER — Encounter (HOSPITAL_COMMUNITY): Payer: Self-pay | Admitting: Interventional Cardiology

## 2016-05-26 ENCOUNTER — Other Ambulatory Visit (HOSPITAL_COMMUNITY): Payer: Medicare HMO

## 2016-07-01 NOTE — Progress Notes (Deleted)
HPI: FU chest pain. Chest x-ray February 2018 showed no acute disease. Echocardiogram February 2018 showed normal LV systolic function, grade 1 diastolic dysfunction and mild tricuspid regurgitation. Exercise treadmill February 2018 normal. Cardiac catheterization February 2018 showed 10% RCA, 25% LAD and 35% second diagonal. LV end-diastolic pressure normal and ejection fraction 55-65%.  CTA February 2018 showed no pulmonary embolus. Since last seen,   Current Outpatient Prescriptions  Medication Sig Dispense Refill  . aspirin EC 81 MG tablet Take 81 mg by mouth daily.    . clonazePAM (KLONOPIN) 0.5 MG tablet Take 0.25-0.5 tablets by mouth at bedtime as needed for anxiety (sleep).     . diphenhydrAMINE (BENADRYL) 25 mg capsule Take 25 mg by mouth every 6 (six) hours as needed for allergies.     Marland Kitchen ESTRACE VAGINAL 0.1 MG/GM vaginal cream USE 1 GRAM VAGINALLY ONCE WEEKLY  2  . loratadine (CLARITIN) 10 MG tablet Take 10 mg by mouth daily as needed for allergies.    . Multiple Vitamin (MULTIVITAMIN WITH MINERALS) TABS tablet Take 2 tablets by mouth daily with lunch.    . nitroGLYCERIN (NITROSTAT) 0.4 MG SL tablet Place 0.4 mg under the tongue every 5 (five) minutes as needed for chest pain.     . Tetrahydrozoline HCl (VISINE OP) Apply 1 drop to eye daily as needed (itching/redness).    . venlafaxine XR (EFFEXOR XR) 150 MG 24 hr capsule Take 150 mg by mouth daily with breakfast.    . Wheat Dextrin (BENEFIBER) POWD Take 1 tablespoon every evening     No current facility-administered medications for this visit.      Past Medical History:  Diagnosis Date  . Anxiety   . Arthritis   . Depression   . GERD (gastroesophageal reflux disease)   . Hyperlipidemia   . Mild exercise-induced asthma   . Right knee meniscal tear   . Vitamin D deficiency     Past Surgical History:  Procedure Laterality Date  . FOOT GANGLION EXCISION  x2   yrs ago  . KNEE ARTHROSCOPY Right 02/06/2015   Procedure:  ARTHROSCOPY RIGHT KNEE WITH LATERAL MENSICAL DEBRIDEMENT;  Surgeon: Gaynelle Arabian, MD;  Location: [facility];  Service: Orthopedics;  Laterality: Right;  . LEFT HEART CATH AND CORONARY ANGIOGRAPHY N/A 05/19/2016   Procedure: Left Heart Cath and Coronary Angiography;  Surgeon: Jettie Booze, MD;  Location: [facility];  Service: Cardiovascular;  Laterality: N/A;  . NASAL SINUS SURGERY  2004  . TOTAL HIP ARTHROPLASTY Right 2012  . TUBAL LIGATION  ysr ago    Social History   Social History  . Marital status: Divorced    Spouse name: N/A  . Number of children: 2  . Years of education: N/A   Occupational History  . Not on file.   Social History Main Topics  . Smoking status: Never Smoker  . Smokeless tobacco: Never Used  . Alcohol use 8.4 oz/week    7 Glasses of wine, 7 Shots of liquor per week     Comment: daily wine and drink  . Drug use: No  . Sexual activity: Not on file   Other Topics Concern  . Not on file   Social History Narrative  . No narrative on file    Family History  Problem Relation Age of Onset  . Hypertension Mother   . Depression Father     ROS: no fevers or chills, productive cough, hemoptysis, dysphasia, odynophagia, melena,  hematochezia, dysuria, hematuria, rash, seizure activity, orthopnea, PND, pedal edema, claudication. Remaining systems are negative.  Physical Exam: Well-developed well-nourished in no acute distress.  Skin is warm and dry.  HEENT is normal.  Neck is supple.  Chest is clear to auscultation with normal expansion.  Cardiovascular exam is regular rate and rhythm.  Abdominal exam nontender or distended. No masses palpated. Extremities show no edema. neuro grossly intact  ECG- personally reviewed  A/P  1  Sheila Ruths, MD

## 2016-07-07 ENCOUNTER — Ambulatory Visit: Payer: Medicare HMO | Admitting: Cardiology

## 2016-10-08 DIAGNOSIS — R69 Illness, unspecified: Secondary | ICD-10-CM | POA: Diagnosis not present

## 2016-10-28 DIAGNOSIS — R1030 Lower abdominal pain, unspecified: Secondary | ICD-10-CM | POA: Diagnosis not present

## 2016-12-14 DIAGNOSIS — R109 Unspecified abdominal pain: Secondary | ICD-10-CM | POA: Diagnosis not present

## 2016-12-23 DIAGNOSIS — R69 Illness, unspecified: Secondary | ICD-10-CM | POA: Diagnosis not present

## 2016-12-24 DIAGNOSIS — R69 Illness, unspecified: Secondary | ICD-10-CM | POA: Diagnosis not present

## 2017-03-02 DIAGNOSIS — R739 Hyperglycemia, unspecified: Secondary | ICD-10-CM | POA: Diagnosis not present

## 2017-03-02 DIAGNOSIS — R030 Elevated blood-pressure reading, without diagnosis of hypertension: Secondary | ICD-10-CM | POA: Diagnosis not present

## 2017-03-02 DIAGNOSIS — Z0000 Encounter for general adult medical examination without abnormal findings: Secondary | ICD-10-CM | POA: Diagnosis not present

## 2017-03-02 DIAGNOSIS — M8588 Other specified disorders of bone density and structure, other site: Secondary | ICD-10-CM | POA: Diagnosis not present

## 2017-03-02 DIAGNOSIS — E7800 Pure hypercholesterolemia, unspecified: Secondary | ICD-10-CM | POA: Diagnosis not present

## 2017-03-02 DIAGNOSIS — Z23 Encounter for immunization: Secondary | ICD-10-CM | POA: Diagnosis not present

## 2017-03-02 DIAGNOSIS — Z1389 Encounter for screening for other disorder: Secondary | ICD-10-CM | POA: Diagnosis not present

## 2017-03-16 ENCOUNTER — Other Ambulatory Visit: Payer: Self-pay | Admitting: Internal Medicine

## 2017-03-16 DIAGNOSIS — Z1231 Encounter for screening mammogram for malignant neoplasm of breast: Secondary | ICD-10-CM

## 2017-04-02 DIAGNOSIS — H35363 Drusen (degenerative) of macula, bilateral: Secondary | ICD-10-CM | POA: Diagnosis not present

## 2017-04-02 DIAGNOSIS — H40013 Open angle with borderline findings, low risk, bilateral: Secondary | ICD-10-CM | POA: Diagnosis not present

## 2017-04-02 DIAGNOSIS — H43813 Vitreous degeneration, bilateral: Secondary | ICD-10-CM | POA: Diagnosis not present

## 2017-04-14 ENCOUNTER — Ambulatory Visit
Admission: RE | Admit: 2017-04-14 | Discharge: 2017-04-14 | Disposition: A | Discharge status: Home / Self Care | Payer: Medicare HMO | Source: Ambulatory Visit | Attending: Internal Medicine | Admitting: Internal Medicine

## 2017-04-14 DIAGNOSIS — Z1231 Encounter for screening mammogram for malignant neoplasm of breast: Secondary | ICD-10-CM

## 2017-04-22 DIAGNOSIS — R69 Illness, unspecified: Secondary | ICD-10-CM | POA: Diagnosis not present

## 2017-06-22 DIAGNOSIS — E7800 Pure hypercholesterolemia, unspecified: Secondary | ICD-10-CM | POA: Diagnosis not present

## 2017-06-22 DIAGNOSIS — I10 Essential (primary) hypertension: Secondary | ICD-10-CM | POA: Diagnosis not present

## 2017-06-29 DIAGNOSIS — R69 Illness, unspecified: Secondary | ICD-10-CM | POA: Diagnosis not present

## 2017-08-03 DIAGNOSIS — Z79899 Other long term (current) drug therapy: Secondary | ICD-10-CM | POA: Diagnosis not present

## 2017-08-03 DIAGNOSIS — I10 Essential (primary) hypertension: Secondary | ICD-10-CM | POA: Diagnosis not present

## 2017-11-01 DIAGNOSIS — R69 Illness, unspecified: Secondary | ICD-10-CM | POA: Diagnosis not present

## 2017-11-13 DIAGNOSIS — R0789 Other chest pain: Secondary | ICD-10-CM | POA: Diagnosis not present

## 2017-11-13 DIAGNOSIS — T7840XA Allergy, unspecified, initial encounter: Secondary | ICD-10-CM | POA: Diagnosis not present

## 2017-11-13 DIAGNOSIS — X58XXXA Exposure to other specified factors, initial encounter: Secondary | ICD-10-CM | POA: Diagnosis not present

## 2017-11-13 DIAGNOSIS — T63461A Toxic effect of venom of wasps, accidental (unintentional), initial encounter: Secondary | ICD-10-CM | POA: Diagnosis not present

## 2017-11-13 DIAGNOSIS — L500 Allergic urticaria: Secondary | ICD-10-CM | POA: Diagnosis not present

## 2017-11-15 DIAGNOSIS — T63441S Toxic effect of venom of bees, accidental (unintentional), sequela: Secondary | ICD-10-CM | POA: Diagnosis not present

## 2017-12-23 DIAGNOSIS — Z23 Encounter for immunization: Secondary | ICD-10-CM | POA: Diagnosis not present

## 2018-01-04 DIAGNOSIS — R69 Illness, unspecified: Secondary | ICD-10-CM | POA: Diagnosis not present

## 2018-02-09 DIAGNOSIS — R69 Illness, unspecified: Secondary | ICD-10-CM | POA: Diagnosis not present

## 2018-03-11 ENCOUNTER — Other Ambulatory Visit: Payer: Self-pay | Admitting: Internal Medicine

## 2018-03-11 DIAGNOSIS — M8588 Other specified disorders of bone density and structure, other site: Secondary | ICD-10-CM | POA: Diagnosis not present

## 2018-03-11 DIAGNOSIS — Z1389 Encounter for screening for other disorder: Secondary | ICD-10-CM | POA: Diagnosis not present

## 2018-03-11 DIAGNOSIS — I10 Essential (primary) hypertension: Secondary | ICD-10-CM | POA: Diagnosis not present

## 2018-03-11 DIAGNOSIS — Z0000 Encounter for general adult medical examination without abnormal findings: Secondary | ICD-10-CM | POA: Diagnosis not present

## 2018-03-11 DIAGNOSIS — E7800 Pure hypercholesterolemia, unspecified: Secondary | ICD-10-CM | POA: Diagnosis not present

## 2018-03-11 DIAGNOSIS — R69 Illness, unspecified: Secondary | ICD-10-CM | POA: Diagnosis not present

## 2018-04-21 DIAGNOSIS — R69 Illness, unspecified: Secondary | ICD-10-CM | POA: Diagnosis not present

## 2018-05-06 ENCOUNTER — Ambulatory Visit
Admission: RE | Admit: 2018-05-06 | Discharge: 2018-05-06 | Disposition: A | Discharge status: Home / Self Care | Payer: Medicare HMO | Source: Ambulatory Visit | Attending: Internal Medicine | Admitting: Internal Medicine

## 2018-05-06 DIAGNOSIS — M8588 Other specified disorders of bone density and structure, other site: Secondary | ICD-10-CM | POA: Diagnosis not present

## 2018-05-06 DIAGNOSIS — Z780 Asymptomatic menopausal state: Secondary | ICD-10-CM | POA: Diagnosis not present

## 2018-05-26 DIAGNOSIS — M25551 Pain in right hip: Secondary | ICD-10-CM | POA: Diagnosis not present

## 2018-08-29 DIAGNOSIS — R69 Illness, unspecified: Secondary | ICD-10-CM | POA: Diagnosis not present

## 2018-08-31 DIAGNOSIS — R69 Illness, unspecified: Secondary | ICD-10-CM | POA: Diagnosis not present

## 2018-09-08 DIAGNOSIS — H40013 Open angle with borderline findings, low risk, bilateral: Secondary | ICD-10-CM | POA: Diagnosis not present

## 2018-09-08 DIAGNOSIS — H35363 Drusen (degenerative) of macula, bilateral: Secondary | ICD-10-CM | POA: Diagnosis not present

## 2018-09-08 DIAGNOSIS — H2513 Age-related nuclear cataract, bilateral: Secondary | ICD-10-CM | POA: Diagnosis not present

## 2018-09-08 DIAGNOSIS — H25013 Cortical age-related cataract, bilateral: Secondary | ICD-10-CM | POA: Diagnosis not present

## 2018-11-29 DIAGNOSIS — D485 Neoplasm of uncertain behavior of skin: Secondary | ICD-10-CM | POA: Diagnosis not present

## 2018-11-29 DIAGNOSIS — L570 Actinic keratosis: Secondary | ICD-10-CM | POA: Diagnosis not present

## 2018-12-27 DIAGNOSIS — R69 Illness, unspecified: Secondary | ICD-10-CM | POA: Diagnosis not present

## 2019-01-26 DIAGNOSIS — R69 Illness, unspecified: Secondary | ICD-10-CM | POA: Diagnosis not present

## 2019-03-06 ENCOUNTER — Encounter (HOSPITAL_COMMUNITY): Payer: Self-pay

## 2019-03-06 ENCOUNTER — Emergency Department (HOSPITAL_COMMUNITY)
Admission: EM | Admit: 2019-03-06 | Discharge: 2019-03-06 | Disposition: A | Discharge status: Home / Self Care | Payer: Medicare HMO | Attending: Emergency Medicine | Admitting: Emergency Medicine

## 2019-03-06 ENCOUNTER — Emergency Department (HOSPITAL_COMMUNITY): Payer: Medicare HMO

## 2019-03-06 ENCOUNTER — Other Ambulatory Visit: Payer: Self-pay

## 2019-03-06 DIAGNOSIS — Z79899 Other long term (current) drug therapy: Secondary | ICD-10-CM | POA: Insufficient documentation

## 2019-03-06 DIAGNOSIS — R0789 Other chest pain: Secondary | ICD-10-CM | POA: Insufficient documentation

## 2019-03-06 DIAGNOSIS — R1084 Generalized abdominal pain: Secondary | ICD-10-CM | POA: Diagnosis not present

## 2019-03-06 DIAGNOSIS — R1010 Upper abdominal pain, unspecified: Secondary | ICD-10-CM | POA: Diagnosis not present

## 2019-03-06 DIAGNOSIS — Z7982 Long term (current) use of aspirin: Secondary | ICD-10-CM | POA: Insufficient documentation

## 2019-03-06 DIAGNOSIS — I10 Essential (primary) hypertension: Secondary | ICD-10-CM | POA: Diagnosis not present

## 2019-03-06 DIAGNOSIS — R079 Chest pain, unspecified: Secondary | ICD-10-CM | POA: Diagnosis not present

## 2019-03-06 DIAGNOSIS — R52 Pain, unspecified: Secondary | ICD-10-CM | POA: Diagnosis not present

## 2019-03-06 HISTORY — DX: Essential (primary) hypertension: I10

## 2019-03-06 LAB — CBC
HCT: 42.7 % (ref 36.0–46.0)
Hemoglobin: 14.3 g/dL (ref 12.0–15.0)
MCH: 33.7 pg (ref 26.0–34.0)
MCHC: 33.5 g/dL (ref 30.0–36.0)
MCV: 100.7 fL — ABNORMAL HIGH (ref 80.0–100.0)
Platelets: 251 10*3/uL (ref 150–400)
RBC: 4.24 MIL/uL (ref 3.87–5.11)
RDW: 13.1 % (ref 11.5–15.5)
WBC: 3.5 10*3/uL — ABNORMAL LOW (ref 4.0–10.5)
nRBC: 0 % (ref 0.0–0.2)

## 2019-03-06 LAB — HEPATIC FUNCTION PANEL
ALT: 21 U/L (ref 0–44)
AST: 27 U/L (ref 15–41)
Albumin: 3.6 g/dL (ref 3.5–5.0)
Alkaline Phosphatase: 58 U/L (ref 38–126)
Bilirubin, Direct: 0.1 mg/dL (ref 0.0–0.2)
Indirect Bilirubin: 1.2 mg/dL — ABNORMAL HIGH (ref 0.3–0.9)
Total Bilirubin: 1.3 mg/dL — ABNORMAL HIGH (ref 0.3–1.2)
Total Protein: 6.5 g/dL (ref 6.5–8.1)

## 2019-03-06 LAB — BASIC METABOLIC PANEL
Anion gap: 9 (ref 5–15)
BUN: 15 mg/dL (ref 8–23)
CO2: 27 mmol/L (ref 22–32)
Calcium: 9.2 mg/dL (ref 8.9–10.3)
Chloride: 104 mmol/L (ref 98–111)
Creatinine, Ser: 0.94 mg/dL (ref 0.44–1.00)
GFR calc Af Amer: >60 mL/min (ref >60)
GFR calc non Af Amer: 60 mL/min — ABNORMAL LOW (ref >60)
Glucose, Bld: 102 mg/dL — ABNORMAL HIGH (ref 70–99)
Potassium: 4 mmol/L (ref 3.5–5.1)
Sodium: 140 mmol/L (ref 135–145)

## 2019-03-06 LAB — LIPASE, BLOOD: Lipase: 33 U/L (ref 11–51)

## 2019-03-06 LAB — TROPONIN I (HIGH SENSITIVITY): Troponin I (High Sensitivity): 3 ng/L (ref <18)

## 2019-03-06 NOTE — ED Provider Notes (Signed)
[facility] EMERGENCY DEPARTMENT Provider Note   CSN: [id number] Arrival date & time: 03/06/19  [id number]     History   Chief Complaint Chief Complaint  Patient presents with  . Chest Pain    HPI Sheila Burke is a 73 y.o. female.     73 yo F with a chief complaints of chest pain.  This been off and on for the past week or so.  Pain is sternal.  Feels like a pressure.  No radiation no shortness of breath no diaphoresis nausea.  Not exertional.  Pain was intermittent and then became persistent yesterday.  She contacted her family doctor this morning who told her to come to the ED for evaluation.  Has a history of hypertension.  She has a mildly elevated cholesterol but this is due to her HDL not on medicine.  Denies diabetes denies smoking denies family history of MI.  She denies history of PE or DVT denies hemoptysis denies unilateral lower extremity edema denies recent surgery immobilization travel or estrogen use.  The history is provided by the patient.  Chest Pain Pain location:  Substernal area Pain quality: pressure   Pain radiates to:  Does not radiate Pain severity:  Moderate Onset quality:  Gradual Duration:  1 week Timing:  Constant Progression:  Worsening Chronicity:  New Relieved by:  Nothing Worsened by:  Nothing Ineffective treatments:  None tried Associated symptoms: no dizziness, no fever, no headache, no nausea, no palpitations, no shortness of breath and no vomiting     Past Medical History:  Diagnosis Date  . Anxiety   . Arthritis   . Depression   . GERD (gastroesophageal reflux disease)   . Hyperlipidemia   . Hypertension   . Mild exercise-induced asthma   . Right knee meniscal tear   . Vitamin D deficiency     Patient Active Problem List   Diagnosis Date Noted  . Precordial pain   . Lateral meniscal tear 02/06/2015    Past Surgical History:  Procedure Laterality Date  . FOOT GANGLION EXCISION  x2   yrs ago  . KNEE  ARTHROSCOPY Right 02/06/2015   Procedure: ARTHROSCOPY RIGHT KNEE WITH LATERAL MENSICAL DEBRIDEMENT;  Surgeon: Gaynelle Arabian, MD;  Location: [facility];  Service: Orthopedics;  Laterality: Right;  . LEFT HEART CATH AND CORONARY ANGIOGRAPHY N/A 05/19/2016   Procedure: Left Heart Cath and Coronary Angiography;  Surgeon: Jettie Booze, MD;  Location: [facility];  Service: Cardiovascular;  Laterality: N/A;  . NASAL SINUS SURGERY  2004  . TOTAL HIP ARTHROPLASTY Right 2012  . TUBAL LIGATION  ysr ago     OB History   No obstetric history on file.      Home Medications    Prior to Admission medications   Medication Sig Start Date End Date Taking? Authorizing Provider  aspirin EC 81 MG tablet Take 81 mg by mouth daily.    [provider]  clonazePAM (KLONOPIN) 0.5 MG tablet Take 0.25-0.5 tablets by mouth at bedtime as needed for anxiety (sleep).  04/21/16   [provider]  diphenhydrAMINE (BENADRYL) 25 mg capsule Take 25 mg by mouth every 6 (six) hours as needed for allergies.     [provider]  ESTRACE VAGINAL 0.1 MG/GM vaginal cream USE 1 GRAM VAGINALLY ONCE WEEKLY 03/03/16   [provider]  loratadine (CLARITIN) 10 MG tablet Take 10 mg by mouth daily as needed for allergies.    [provider]  Multiple Vitamin (MULTIVITAMIN WITH MINERALS) TABS tablet Take 2 tablets by mouth daily with lunch.    [provider]  nitroGLYCERIN (NITROSTAT) 0.4 MG SL tablet Place 0.4 mg under the tongue every 5 (five) minutes as needed for chest pain.  05/01/16   [provider]  Tetrahydrozoline HCl (VISINE OP) Apply 1 drop to eye daily as needed (itching/redness).    [provider]  venlafaxine XR (EFFEXOR XR) 150 MG 24 hr capsule Take 150 mg by mouth daily with breakfast.    [provider]  Wheat Dextrin (BENEFIBER) POWD Take 1 tablespoon every evening    [provider]    Family History  Family History  Problem Relation Age of Onset  . Hypertension Mother   . Depression Father     Social History Social History   Tobacco Use  . Smoking status: Never Smoker  . Smokeless tobacco: Never Used  Substance Use Topics  . Alcohol use: Yes    Comment: '1 vodka tonic every night"  . Drug use: No     Allergies   Bee venom, Mobic [meloxicam], Sulfa antibiotics, and Other   Review of Systems Review of Systems  Constitutional: Negative for chills and fever.  HENT: Negative for congestion and rhinorrhea.   Eyes: Negative for redness and visual disturbance.  Respiratory: Negative for shortness of breath and wheezing.   Cardiovascular: Positive for chest pain. Negative for palpitations.  Gastrointestinal: Negative for nausea and vomiting.  Genitourinary: Negative for dysuria and urgency.  Musculoskeletal: Negative for arthralgias and myalgias.  Skin: Negative for pallor and wound.  Neurological: Negative for dizziness and headaches.     Physical Exam Updated Vital Signs BP 140/78   Pulse 66   Temp 98.2 F (36.8 C) (Oral)   Resp 15   Ht 5\' 5"  (1.651 m)   Wt 59 kg   SpO2 100%   BMI 21.63 kg/m   Physical Exam Vitals signs and nursing note reviewed.  Constitutional:      General: She is not in acute distress.    Appearance: She is well-developed. She is not diaphoretic.  HENT:     Head: Normocephalic and atraumatic.  Eyes:     Pupils: Pupils are equal, round, and reactive to light.  Neck:     Musculoskeletal: Normal range of motion and neck supple.  Cardiovascular:     Rate and Rhythm: Normal rate and regular rhythm.     Heart sounds: No murmur. No friction rub. No gallop.   Pulmonary:     Effort: Pulmonary effort is normal.     Breath sounds: No wheezing, rhonchi or rales.  Chest:     Chest wall: Tenderness present.     Comments: Pain is reproduced with palpation of the left lower sternal border about ribs 5 and 6. Abdominal:     General: There is  no distension.     Palpations: Abdomen is soft.     Tenderness: There is no abdominal tenderness.  Musculoskeletal:        General: No tenderness.     Right lower leg: No edema.     Left lower leg: No edema.  Skin:    General: Skin is warm and dry.  Neurological:     Mental Status: She is alert and oriented to person, place, and time.  Psychiatric:        Behavior: Behavior normal.      ED Treatments / Results  Labs (all labs ordered  are listed, but only abnormal results are displayed) Labs Reviewed  BASIC METABOLIC PANEL - Abnormal; Notable for the following components:      Result Value   Glucose, Bld 102 (*)    GFR calc non Af Amer 60 (*)    All other components within normal limits  CBC - Abnormal; Notable for the following components:   WBC 3.5 (*)    MCV 100.7 (*)    All other components within normal limits  HEPATIC FUNCTION PANEL - Abnormal; Notable for the following components:   Total Bilirubin 1.3 (*)    Indirect Bilirubin 1.2 (*)    All other components within normal limits  LIPASE, BLOOD  TROPONIN I (HIGH SENSITIVITY)  TROPONIN I (HIGH SENSITIVITY)    EKG EKG Interpretation  Date/Time:  Monday March 06 2019 09:29:09 EST Ventricular Rate:  74 PR Interval:    QRS Duration: 96 QT Interval:  400 QTC Calculation: 444 R Axis:   60 Text Interpretation: Sinus rhythm Consider right atrial enlargement Baseline wander in lead(s) V3 No significant change since last tracing Confirmed by Deno Etienne [id number]) on 03/06/2019 9:34:50 AM   Radiology Dg Chest 2 View  Result Date: 03/06/2019 CLINICAL DATA:  Chest pain EXAM: CHEST - 2 VIEW COMPARISON:  2018 FINDINGS: The heart size and mediastinal contours are within normal limits. Both lungs are clear. No pleural effusion or pneumothorax. Mild degenerative changes of the included spine. IMPRESSION: No acute process in the chest. Electronically Signed   By: Macy Mis M.D.   On: 03/06/2019 10:18    Procedures  Procedures (including critical care time)  Medications Ordered in ED Medications - No data to display   Initial Impression / Assessment and Plan / ED Course  I have reviewed the triage vital signs and the nursing notes.  Pertinent labs & imaging results that were available during my care of the patient were reviewed by me and considered in my medical decision making (see chart for details).        73 yo F with a chief complaints of chest pain.  This is atypical in nature and reproduced on exam.  Patient's EKG does not show any concerning changes.  She is not anemic has no significant electrolyte abnormalities.  I feel her pain is atypical of pulmonary embolism and no further work-up for that is required.  Her troponin is negative, she has had no significant change in her symptoms greater than 6 hours.  I do not feel that her repeat is warranted.  Most likely this is musculoskeletal in nature.  Patient has scheduled follow-up with her PCP in 1 week.   LFT and lipase negative.  D/c home.   11:56 AM:  I have discussed the diagnosis/risks/treatment options with the patient and believe the pt to be eligible for discharge home to follow-up with PCP. We also discussed returning to the ED immediately if new or worsening sx occur. We discussed the sx which are most concerning (e.g., sudden worsening pain, fever, inability to tolerate by mouth) that necessitate immediate return. Medications administered to the patient during their visit and any new prescriptions provided to the patient are listed below.  Medications given during this visit Medications - No data to display   The patient appears reasonably screen and/or stabilized for discharge and I doubt any other medical condition or other [facility] requiring further screening, evaluation, or treatment in the ED at this time prior to discharge.   Final Clinical Impressions(s) / ED Diagnoses  Final diagnoses:  Atypical chest pain    ED Discharge  Orders    None       Deno Etienne, DO 03/06/19 1156

## 2019-03-06 NOTE — Discharge Instructions (Signed)
Your marker for your heart damage is negative.  Your labs otherwise were unremarkable, seems unlikely that this is an issue with your liver or your pancreas.  Chest x-ray is without pneumonia.  We will treat this for a muscular strain.  Tylenol and ibuprofen or naproxen if your Dr. Arloa Koh you take those kind of medicines.  Please return to the ED for worsening symptoms or if you realize they resulted in shortness of breath if you get sweaty or throw up especially when you are exercising.

## 2019-03-06 NOTE — ED Triage Notes (Signed)
Pt from home via ems; c/o upper abd pressure x 1 week; this am it moved into central chest; pt called pcp, who directed pt to come to ed; hx anxiety, htn, has not taken meds this am; 324 asa, 1 nitro given PTA, some relief  150/70 HR 78 sinus RR 20 98% RA CBG 160

## 2019-03-06 NOTE — ED Notes (Signed)
Pt speaking on phone with daughter Webb Silversmith)

## 2019-03-06 NOTE — ED Notes (Signed)
Webb Silversmith (daughter) [phone]

## 2019-03-06 NOTE — ED Notes (Signed)
Patient verbalizes understanding of discharge instructions. Opportunity for questioning and answers were provided. Pt discharged from ED. 

## 2019-03-09 DIAGNOSIS — R69 Illness, unspecified: Secondary | ICD-10-CM | POA: Diagnosis not present

## 2019-03-14 DIAGNOSIS — E7800 Pure hypercholesterolemia, unspecified: Secondary | ICD-10-CM | POA: Diagnosis not present

## 2019-03-14 DIAGNOSIS — Z1389 Encounter for screening for other disorder: Secondary | ICD-10-CM | POA: Diagnosis not present

## 2019-03-14 DIAGNOSIS — Z0000 Encounter for general adult medical examination without abnormal findings: Secondary | ICD-10-CM | POA: Diagnosis not present

## 2019-03-14 DIAGNOSIS — R69 Illness, unspecified: Secondary | ICD-10-CM | POA: Diagnosis not present

## 2019-03-14 DIAGNOSIS — I10 Essential (primary) hypertension: Secondary | ICD-10-CM | POA: Diagnosis not present

## 2019-04-05 DIAGNOSIS — R69 Illness, unspecified: Secondary | ICD-10-CM | POA: Diagnosis not present

## 2019-04-16 ENCOUNTER — Ambulatory Visit: Payer: Medicare Other | Attending: Internal Medicine

## 2019-04-16 DIAGNOSIS — Z23 Encounter for immunization: Secondary | ICD-10-CM

## 2019-04-16 NOTE — Progress Notes (Signed)
   Covid-19 Vaccination Clinic  Name:  Sheila Burke    MRN: [id number] DOB: 01-Nov-1945  04/16/2019  Ms. Mckneely was observed post Covid-19 immunization for 15 minutes without incidence. She was provided with Vaccine Information Sheet and instruction to access the V-Safe system.   Ms. Tonn was instructed to call 911 with any severe reactions post vaccine: Marland Kitchen Difficulty breathing  . Swelling of your face and throat  . A fast heartbeat  . A bad rash all over your body  . Dizziness and weakness

## 2019-04-26 DIAGNOSIS — E7800 Pure hypercholesterolemia, unspecified: Secondary | ICD-10-CM | POA: Diagnosis not present

## 2019-04-26 DIAGNOSIS — Z5181 Encounter for therapeutic drug level monitoring: Secondary | ICD-10-CM | POA: Diagnosis not present

## 2019-05-05 ENCOUNTER — Ambulatory Visit: Payer: Medicare HMO | Attending: Internal Medicine

## 2019-05-05 DIAGNOSIS — Z23 Encounter for immunization: Secondary | ICD-10-CM | POA: Insufficient documentation

## 2019-05-05 NOTE — Progress Notes (Signed)
   Covid-19 Vaccination Clinic  Name:  Sheila Burke    MRN: [id number] DOB: November 24, 1945  05/05/2019  Sheila Burke was observed post Covid-19 immunization for 30 minutes based on pre-vaccination screening without incidence. She was provided with Vaccine Information Sheet and instruction to access the V-Safe system.   Sheila Burke was instructed to call 911 with any severe reactions post vaccine: Marland Kitchen Difficulty breathing  . Swelling of your face and throat  . A fast heartbeat  . A bad rash all over your body  . Dizziness and weakness    Immunizations Administered    Name Date Dose VIS Date Route   Pfizer COVID-19 Vaccine 05/05/2019 10:38 AM 0.3 mL 03/10/2019 Intramuscular   Manufacturer: [place]   Lot: [id number]   [place]: [id number]

## 2019-05-31 DIAGNOSIS — Z20828 Contact with and (suspected) exposure to other viral communicable diseases: Secondary | ICD-10-CM | POA: Diagnosis not present

## 2019-06-05 DIAGNOSIS — R109 Unspecified abdominal pain: Secondary | ICD-10-CM | POA: Diagnosis not present

## 2019-06-27 DIAGNOSIS — R69 Illness, unspecified: Secondary | ICD-10-CM | POA: Diagnosis not present

## 2019-06-28 DIAGNOSIS — E7800 Pure hypercholesterolemia, unspecified: Secondary | ICD-10-CM | POA: Diagnosis not present

## 2019-09-27 DIAGNOSIS — Z5181 Encounter for therapeutic drug level monitoring: Secondary | ICD-10-CM | POA: Diagnosis not present

## 2019-09-27 DIAGNOSIS — E7800 Pure hypercholesterolemia, unspecified: Secondary | ICD-10-CM | POA: Diagnosis not present

## 2019-11-22 DIAGNOSIS — R69 Illness, unspecified: Secondary | ICD-10-CM | POA: Diagnosis not present

## 2019-11-23 DIAGNOSIS — R69 Illness, unspecified: Secondary | ICD-10-CM | POA: Diagnosis not present

## 2020-01-04 ENCOUNTER — Other Ambulatory Visit: Payer: Self-pay | Admitting: Internal Medicine

## 2020-01-04 DIAGNOSIS — Z1231 Encounter for screening mammogram for malignant neoplasm of breast: Secondary | ICD-10-CM

## 2020-01-26 ENCOUNTER — Ambulatory Visit
Admission: RE | Admit: 2020-01-26 | Discharge: 2020-01-26 | Disposition: A | Discharge status: Home / Self Care | Payer: Medicare HMO | Source: Ambulatory Visit | Attending: Internal Medicine | Admitting: Internal Medicine

## 2020-01-26 ENCOUNTER — Other Ambulatory Visit: Payer: Self-pay

## 2020-01-26 DIAGNOSIS — Z1231 Encounter for screening mammogram for malignant neoplasm of breast: Secondary | ICD-10-CM

## 2020-02-13 ENCOUNTER — Emergency Department (HOSPITAL_COMMUNITY)
Admission: EM | Admit: 2020-02-13 | Discharge: 2020-02-13 | Disposition: A | Discharge status: Home / Self Care | Payer: Medicare HMO | Attending: Emergency Medicine | Admitting: Emergency Medicine

## 2020-02-13 ENCOUNTER — Emergency Department (HOSPITAL_COMMUNITY): Payer: Medicare HMO

## 2020-02-13 ENCOUNTER — Encounter (HOSPITAL_COMMUNITY): Payer: Self-pay

## 2020-02-13 ENCOUNTER — Other Ambulatory Visit: Payer: Self-pay

## 2020-02-13 DIAGNOSIS — R079 Chest pain, unspecified: Secondary | ICD-10-CM | POA: Diagnosis not present

## 2020-02-13 DIAGNOSIS — I10 Essential (primary) hypertension: Secondary | ICD-10-CM | POA: Insufficient documentation

## 2020-02-13 DIAGNOSIS — R072 Precordial pain: Secondary | ICD-10-CM | POA: Diagnosis present

## 2020-02-13 DIAGNOSIS — R0789 Other chest pain: Secondary | ICD-10-CM | POA: Diagnosis not present

## 2020-02-13 DIAGNOSIS — Z96641 Presence of right artificial hip joint: Secondary | ICD-10-CM | POA: Insufficient documentation

## 2020-02-13 DIAGNOSIS — Z7982 Long term (current) use of aspirin: Secondary | ICD-10-CM | POA: Insufficient documentation

## 2020-02-13 DIAGNOSIS — J90 Pleural effusion, not elsewhere classified: Secondary | ICD-10-CM | POA: Diagnosis not present

## 2020-02-13 DIAGNOSIS — R42 Dizziness and giddiness: Secondary | ICD-10-CM | POA: Diagnosis not present

## 2020-02-13 DIAGNOSIS — Z23 Encounter for immunization: Secondary | ICD-10-CM | POA: Diagnosis not present

## 2020-02-13 LAB — BASIC METABOLIC PANEL
Anion gap: 11 (ref 5–15)
BUN: 13 mg/dL (ref 8–23)
CO2: 26 mmol/L (ref 22–32)
Calcium: 9.8 mg/dL (ref 8.9–10.3)
Chloride: 102 mmol/L (ref 98–111)
Creatinine, Ser: 0.89 mg/dL (ref 0.44–1.00)
GFR, Estimated: >60 mL/min (ref >60)
Glucose, Bld: 102 mg/dL — ABNORMAL HIGH (ref 70–99)
Potassium: 3.9 mmol/L (ref 3.5–5.1)
Sodium: 139 mmol/L (ref 135–145)

## 2020-02-13 LAB — CBC
HCT: 42.7 % (ref 36.0–46.0)
Hemoglobin: 13.9 g/dL (ref 12.0–15.0)
MCH: 32.5 pg (ref 26.0–34.0)
MCHC: 32.6 g/dL (ref 30.0–36.0)
MCV: 99.8 fL (ref 80.0–100.0)
Platelets: 306 10*3/uL (ref 150–400)
RBC: 4.28 MIL/uL (ref 3.87–5.11)
RDW: 13 % (ref 11.5–15.5)
WBC: 4.8 10*3/uL (ref 4.0–10.5)
nRBC: 0 % (ref 0.0–0.2)

## 2020-02-13 LAB — TROPONIN I (HIGH SENSITIVITY)
Troponin I (High Sensitivity): 4 ng/L (ref <18)
Troponin I (High Sensitivity): 4 ng/L (ref <18)

## 2020-02-13 NOTE — ED Provider Notes (Signed)
[facility] EMERGENCY DEPARTMENT Provider Note   CSN: [id number] Arrival date & time: 02/13/20  1216     History Chief Complaint  Patient presents with  . Chest Pain  . Dizziness    Sheila Burke is a 74 y.o. female.  The history is provided by the patient.  Chest Pain Pain location:  Substernal area Pain quality: aching   Pain radiates to:  Does not radiate Pain severity:  Mild Onset quality:  Gradual Timing:  Intermittent Progression:  Waxing and waning Chronicity:  New Context: at rest   Relieved by:  Nothing Worsened by:  Nothing Associated symptoms: dizziness (once, resolved)   Associated symptoms: no abdominal pain, no altered mental status, no back pain, no cough, no fever, no palpitations, no shortness of breath, no vomiting and no weakness   Risk factors: high cholesterol and hypertension   Risk factors: no diabetes mellitus        Past Medical History:  Diagnosis Date  . Anxiety   . Arthritis   . Depression   . GERD (gastroesophageal reflux disease)   . Hyperlipidemia   . Hypertension   . Mild exercise-induced asthma   . Right knee meniscal tear   . Vitamin D deficiency     Patient Active Problem List   Diagnosis Date Noted  . Precordial pain   . Lateral meniscal tear 02/06/2015    Past Surgical History:  Procedure Laterality Date  . AUGMENTATION MAMMAPLASTY     implants and explants   . FOOT GANGLION EXCISION  x2   yrs ago  . KNEE ARTHROSCOPY Right 02/06/2015   Procedure: ARTHROSCOPY RIGHT KNEE WITH LATERAL MENSICAL DEBRIDEMENT;  Surgeon: Gaynelle Arabian, MD;  Location: [facility];  Service: Orthopedics;  Laterality: Right;  . LEFT HEART CATH AND CORONARY ANGIOGRAPHY N/A 05/19/2016   Procedure: Left Heart Cath and Coronary Angiography;  Surgeon: Jettie Booze, MD;  Location: [facility];  Service: Cardiovascular;  Laterality: N/A;  . NASAL SINUS SURGERY  2004  . TOTAL HIP ARTHROPLASTY Right  2012  . TUBAL LIGATION  ysr ago     OB History   No obstetric history on file.     Family History  Problem Relation Age of Onset  . Hypertension Mother   . Depression Father     Social History   Tobacco Use  . Smoking status: Never Smoker  . Smokeless tobacco: Never Used  Substance Use Topics  . Alcohol use: Yes    Comment: '1 vodka tonic every night"  . Drug use: No    Home Medications Prior to Admission medications   Medication Sig Start Date End Date Taking? Authorizing Provider  aspirin EC 81 MG tablet Take 81 mg by mouth daily.    [provider]  clonazePAM (KLONOPIN) 0.5 MG tablet Take 0.25-0.5 tablets by mouth at bedtime as needed for anxiety (sleep).  04/21/16   [provider]  diphenhydrAMINE (BENADRYL) 25 mg capsule Take 25 mg by mouth every 6 (six) hours as needed for allergies.     [provider]  ESTRACE VAGINAL 0.1 MG/GM vaginal cream USE 1 GRAM VAGINALLY ONCE WEEKLY 03/03/16   [provider]  loratadine (CLARITIN) 10 MG tablet Take 10 mg by mouth daily as needed for allergies.    [provider]  Multiple Vitamin (MULTIVITAMIN WITH MINERALS) TABS tablet Take 2 tablets by mouth daily with lunch.    [provider]  nitroGLYCERIN (NITROSTAT) 0.4 MG  SL tablet Place 0.4 mg under the tongue every 5 (five) minutes as needed for chest pain.  05/01/16   [provider]  Tetrahydrozoline HCl (VISINE OP) Apply 1 drop to eye daily as needed (itching/redness).    [provider]  venlafaxine XR (EFFEXOR XR) 150 MG 24 hr capsule Take 150 mg by mouth daily with breakfast.    [provider]  Wheat Dextrin (BENEFIBER) POWD Take 1 tablespoon every evening    [provider]    Allergies    Bee venom, Mobic [meloxicam], Sulfa antibiotics, and Other  Review of Systems   Review of Systems  Constitutional: Negative for chills and fever.  HENT: Negative for ear pain and sore throat.     Eyes: Negative for pain and visual disturbance.  Respiratory: Negative for cough and shortness of breath.   Cardiovascular: Positive for chest pain. Negative for palpitations.  Gastrointestinal: Negative for abdominal pain and vomiting.  Genitourinary: Negative for dysuria and hematuria.  Musculoskeletal: Negative for arthralgias and back pain.  Skin: Negative for color change and rash.  Neurological: Positive for dizziness (once, resolved). Negative for seizures, syncope and weakness.  All other systems reviewed and are negative.   Physical Exam Updated Vital Signs BP (!) 145/71   Pulse 71   Temp 97.6 F (36.4 C) (Temporal)   Resp 15   Ht 5' 5.5" (1.664 m)   Wt 59.4 kg   SpO2 100%   BMI 21.47 kg/m   Physical Exam Vitals and nursing note reviewed.  Constitutional:      General: She is not in acute distress.    Appearance: She is well-developed.  HENT:     Head: Normocephalic and atraumatic.  Eyes:     Extraocular Movements: Extraocular movements intact.     Conjunctiva/sclera: Conjunctivae normal.     Pupils: Pupils are equal, round, and reactive to light.  Cardiovascular:     Rate and Rhythm: Normal rate and regular rhythm.     Pulses:          Radial pulses are 2+ on the right side and 2+ on the left side.       Dorsalis pedis pulses are 2+ on the right side and 2+ on the left side.     Heart sounds: No murmur heard.   Pulmonary:     Effort: Pulmonary effort is normal. No respiratory distress.     Breath sounds: Normal breath sounds. No decreased breath sounds.  Abdominal:     Palpations: Abdomen is soft.     Tenderness: There is no abdominal tenderness.  Musculoskeletal:        General: Normal range of motion.     Cervical back: Normal range of motion and neck supple.  Skin:    General: Skin is warm and dry.     Capillary Refill: Capillary refill takes less than 2 seconds.  Neurological:     General: No focal deficit present.     Mental Status: She is  alert.     ED Results / Procedures / Treatments   Labs (all labs ordered are listed, but only abnormal results are displayed) Labs Reviewed  BASIC METABOLIC PANEL - Abnormal; Notable for the following components:      Result Value   Glucose, Bld 102 (*)    All other components within normal limits  CBC  TROPONIN I (HIGH SENSITIVITY)  TROPONIN I (HIGH SENSITIVITY)    EKG EKG Interpretation  Date/Time:  Tuesday February 13 2020  12:21:11 EST Ventricular Rate:  73 PR Interval:  126 QRS Duration: 88 QT Interval:  386 QTC Calculation: 425 R Axis:   81 Text Interpretation: Normal sinus rhythm Nonspecific ST abnormality normal, no change from old Confirmed by Charlesetta Shanks [phone]) on 02/13/2020 2:23:54 PM   Radiology DG Chest 2 View  Result Date: 02/13/2020 CLINICAL DATA:  74 year old female with history of intermittent chest heaviness and dizziness for the past week. EXAM: CHEST - 2 VIEW COMPARISON:  Chest x-ray 03/06/2019. FINDINGS: Lung volumes are normal. No consolidative airspace disease. No pleural effusions. No pneumothorax. No pulmonary nodule or mass noted. Pulmonary vasculature and the cardiomediastinal silhouette are within normal limits. IMPRESSION: No radiographic evidence of acute cardiopulmonary disease. Electronically Signed   By: Vinnie Langton M.D.   On: 02/13/2020 12:59    Procedures Procedures (including critical care time)  Medications Ordered in ED Medications - No data to display  ED Course  I have reviewed the triage vital signs and the nursing notes.  Pertinent labs & imaging results that were available during my care of the patient were reviewed by me and considered in my medical decision making (see chart for details).    MDM Rules/Calculators/A&P                          Sheila Burke is a 74 year old female with history of high cholesterol, hypertension who presents to the ED with chest pain.  Patient with normal vitals.  No fever.   Patient has had some intermittent chest pain for the last several days.  Does not appear to get worse with exertion.  No diaphoresis.  Had an episode of dizziness several days ago but has not had any since.  She is neurologically intact.  Overall she is chest pain-free.  She went to see her primary care doctor who sent her for evaluation today.  EKG shows sinus rhythm.  No ischemic changes and unchanged from prior EKG.  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  She has already had 2 troponins that are normal.  No pneumonia, pneumothorax on chest x-ray.  No concern for PE given history and physical and risk factors.  Not concern for dissection given history and physical as well.  No significant anemia, electrolyte abnormality, kidney injury.  I did discuss with her about possible observation given cardiac risk factors and intermittent chest pain however patient prefers outpatient work-up with her primary care doctor and possibly cardiology.  Shared decision was made for her to follow-up and she understands return precautions.  Discharged in good condition.  This chart was dictated using voice recognition software.  Despite best efforts to proofread,  errors can occur which can change the documentation meaning.   Final Clinical Impression(s) / ED Diagnoses Final diagnoses:  Chest pain, unspecified type    Rx / DC Orders ED Discharge Orders    None       Lennice Sites, DO 02/13/20 1550

## 2020-02-13 NOTE — ED Triage Notes (Signed)
Pt reports intermittent chest heaviness and dizziness for the past week. Pt denies any SOB. Pt sent here by [facility] clinic for further evaluation.

## 2020-02-13 NOTE — ED Notes (Signed)
Pt denies cp or dizziness at this time. Patient verbalizes understanding of discharge instructions. Opportunity for questioning and answers were provided. Pt discharged from ED ambulatory.

## 2020-03-07 DIAGNOSIS — Z881 Allergy status to other antibiotic agents status: Secondary | ICD-10-CM | POA: Diagnosis not present

## 2020-03-07 DIAGNOSIS — W260XXA Contact with knife, initial encounter: Secondary | ICD-10-CM | POA: Diagnosis not present

## 2020-03-07 DIAGNOSIS — S61012A Laceration without foreign body of left thumb without damage to nail, initial encounter: Secondary | ICD-10-CM | POA: Diagnosis not present

## 2020-04-05 DIAGNOSIS — M25521 Pain in right elbow: Secondary | ICD-10-CM | POA: Diagnosis not present

## 2020-04-17 DIAGNOSIS — E7800 Pure hypercholesterolemia, unspecified: Secondary | ICD-10-CM | POA: Diagnosis not present

## 2020-04-17 DIAGNOSIS — R69 Illness, unspecified: Secondary | ICD-10-CM | POA: Diagnosis not present

## 2020-04-17 DIAGNOSIS — Z1159 Encounter for screening for other viral diseases: Secondary | ICD-10-CM | POA: Diagnosis not present

## 2020-04-17 DIAGNOSIS — Z0000 Encounter for general adult medical examination without abnormal findings: Secondary | ICD-10-CM | POA: Diagnosis not present

## 2020-04-17 DIAGNOSIS — M8588 Other specified disorders of bone density and structure, other site: Secondary | ICD-10-CM | POA: Diagnosis not present

## 2020-04-17 DIAGNOSIS — I10 Essential (primary) hypertension: Secondary | ICD-10-CM | POA: Diagnosis not present

## 2020-04-18 DIAGNOSIS — R69 Illness, unspecified: Secondary | ICD-10-CM | POA: Diagnosis not present

## 2020-05-17 DIAGNOSIS — M79672 Pain in left foot: Secondary | ICD-10-CM | POA: Diagnosis not present

## 2020-05-17 DIAGNOSIS — M722 Plantar fascial fibromatosis: Secondary | ICD-10-CM | POA: Diagnosis not present

## 2020-06-11 DIAGNOSIS — H35363 Drusen (degenerative) of macula, bilateral: Secondary | ICD-10-CM | POA: Diagnosis not present

## 2020-06-11 DIAGNOSIS — H25013 Cortical age-related cataract, bilateral: Secondary | ICD-10-CM | POA: Diagnosis not present

## 2020-06-11 DIAGNOSIS — H2513 Age-related nuclear cataract, bilateral: Secondary | ICD-10-CM | POA: Diagnosis not present

## 2020-06-11 DIAGNOSIS — H40013 Open angle with borderline findings, low risk, bilateral: Secondary | ICD-10-CM | POA: Diagnosis not present

## 2020-07-23 DIAGNOSIS — E7800 Pure hypercholesterolemia, unspecified: Secondary | ICD-10-CM | POA: Diagnosis not present

## 2020-09-24 DIAGNOSIS — R69 Illness, unspecified: Secondary | ICD-10-CM | POA: Diagnosis not present

## 2021-01-31 ENCOUNTER — Other Ambulatory Visit: Payer: Self-pay | Admitting: Internal Medicine

## 2021-01-31 DIAGNOSIS — D485 Neoplasm of uncertain behavior of skin: Secondary | ICD-10-CM | POA: Diagnosis not present

## 2021-01-31 DIAGNOSIS — C44311 Basal cell carcinoma of skin of nose: Secondary | ICD-10-CM | POA: Diagnosis not present

## 2021-01-31 DIAGNOSIS — Z1231 Encounter for screening mammogram for malignant neoplasm of breast: Secondary | ICD-10-CM

## 2021-02-17 DIAGNOSIS — F411 Generalized anxiety disorder: Secondary | ICD-10-CM | POA: Diagnosis not present

## 2021-02-17 DIAGNOSIS — R69 Illness, unspecified: Secondary | ICD-10-CM | POA: Diagnosis not present

## 2021-03-05 ENCOUNTER — Ambulatory Visit
Admission: RE | Admit: 2021-03-05 | Discharge: 2021-03-05 | Disposition: A | Discharge status: Home / Self Care | Payer: Medicare HMO | Source: Ambulatory Visit | Attending: Internal Medicine | Admitting: Internal Medicine

## 2021-03-05 DIAGNOSIS — Z1231 Encounter for screening mammogram for malignant neoplasm of breast: Secondary | ICD-10-CM | POA: Diagnosis not present

## 2021-03-12 ENCOUNTER — Ambulatory Visit: Payer: Medicare HMO

## 2021-05-01 DIAGNOSIS — C4491 Basal cell carcinoma of skin, unspecified: Secondary | ICD-10-CM | POA: Diagnosis not present

## 2021-05-01 DIAGNOSIS — C44311 Basal cell carcinoma of skin of nose: Secondary | ICD-10-CM | POA: Diagnosis not present

## 2021-05-07 DIAGNOSIS — M8588 Other specified disorders of bone density and structure, other site: Secondary | ICD-10-CM | POA: Diagnosis not present

## 2021-05-07 DIAGNOSIS — Z8739 Personal history of other diseases of the musculoskeletal system and connective tissue: Secondary | ICD-10-CM | POA: Diagnosis not present

## 2021-05-07 DIAGNOSIS — M25561 Pain in right knee: Secondary | ICD-10-CM | POA: Diagnosis not present

## 2021-05-07 DIAGNOSIS — Z0000 Encounter for general adult medical examination without abnormal findings: Secondary | ICD-10-CM | POA: Diagnosis not present

## 2021-05-07 DIAGNOSIS — E7800 Pure hypercholesterolemia, unspecified: Secondary | ICD-10-CM | POA: Diagnosis not present

## 2021-05-07 DIAGNOSIS — R69 Illness, unspecified: Secondary | ICD-10-CM | POA: Diagnosis not present

## 2021-05-07 DIAGNOSIS — I10 Essential (primary) hypertension: Secondary | ICD-10-CM | POA: Diagnosis not present

## 2021-07-17 DIAGNOSIS — R69 Illness, unspecified: Secondary | ICD-10-CM | POA: Diagnosis not present

## 2021-07-17 DIAGNOSIS — F411 Generalized anxiety disorder: Secondary | ICD-10-CM | POA: Diagnosis not present

## 2021-07-23 DIAGNOSIS — M79621 Pain in right upper arm: Secondary | ICD-10-CM | POA: Diagnosis not present

## 2021-08-11 DIAGNOSIS — M7541 Impingement syndrome of right shoulder: Secondary | ICD-10-CM | POA: Diagnosis not present

## 2021-08-11 DIAGNOSIS — M25511 Pain in right shoulder: Secondary | ICD-10-CM | POA: Diagnosis not present

## 2021-08-20 DIAGNOSIS — H04123 Dry eye syndrome of bilateral lacrimal glands: Secondary | ICD-10-CM | POA: Diagnosis not present

## 2021-08-20 DIAGNOSIS — H35363 Drusen (degenerative) of macula, bilateral: Secondary | ICD-10-CM | POA: Diagnosis not present

## 2021-08-20 DIAGNOSIS — H25813 Combined forms of age-related cataract, bilateral: Secondary | ICD-10-CM | POA: Diagnosis not present

## 2021-08-20 DIAGNOSIS — H40013 Open angle with borderline findings, low risk, bilateral: Secondary | ICD-10-CM | POA: Diagnosis not present

## 2021-12-24 DIAGNOSIS — F411 Generalized anxiety disorder: Secondary | ICD-10-CM | POA: Diagnosis not present

## 2021-12-24 DIAGNOSIS — R69 Illness, unspecified: Secondary | ICD-10-CM | POA: Diagnosis not present

## 2022-03-31 ENCOUNTER — Other Ambulatory Visit: Payer: Self-pay | Admitting: Internal Medicine

## 2022-03-31 DIAGNOSIS — Z1231 Encounter for screening mammogram for malignant neoplasm of breast: Secondary | ICD-10-CM

## 2022-04-02 ENCOUNTER — Ambulatory Visit
Admission: RE | Admit: 2022-04-02 | Discharge: 2022-04-02 | Disposition: A | Discharge status: Home / Self Care | Payer: Medicare HMO | Source: Ambulatory Visit | Attending: Internal Medicine | Admitting: Internal Medicine

## 2022-04-02 DIAGNOSIS — Z1231 Encounter for screening mammogram for malignant neoplasm of breast: Secondary | ICD-10-CM | POA: Diagnosis not present

## 2022-05-18 ENCOUNTER — Other Ambulatory Visit: Payer: Self-pay | Admitting: Internal Medicine

## 2022-05-18 DIAGNOSIS — E7800 Pure hypercholesterolemia, unspecified: Secondary | ICD-10-CM | POA: Diagnosis not present

## 2022-05-18 DIAGNOSIS — M8588 Other specified disorders of bone density and structure, other site: Secondary | ICD-10-CM | POA: Diagnosis not present

## 2022-05-18 DIAGNOSIS — Z1331 Encounter for screening for depression: Secondary | ICD-10-CM | POA: Diagnosis not present

## 2022-05-18 DIAGNOSIS — I10 Essential (primary) hypertension: Secondary | ICD-10-CM | POA: Diagnosis not present

## 2022-05-18 DIAGNOSIS — F325 Major depressive disorder, single episode, in full remission: Secondary | ICD-10-CM | POA: Diagnosis not present

## 2022-05-18 DIAGNOSIS — R69 Illness, unspecified: Secondary | ICD-10-CM | POA: Diagnosis not present

## 2022-05-18 DIAGNOSIS — Z0000 Encounter for general adult medical examination without abnormal findings: Secondary | ICD-10-CM | POA: Diagnosis not present

## 2022-05-18 DIAGNOSIS — Z8739 Personal history of other diseases of the musculoskeletal system and connective tissue: Secondary | ICD-10-CM | POA: Diagnosis not present

## 2022-05-18 DIAGNOSIS — F5101 Primary insomnia: Secondary | ICD-10-CM | POA: Diagnosis not present

## 2022-05-18 DIAGNOSIS — L03011 Cellulitis of right finger: Secondary | ICD-10-CM | POA: Diagnosis not present

## 2022-05-27 DIAGNOSIS — K644 Residual hemorrhoidal skin tags: Secondary | ICD-10-CM | POA: Diagnosis not present

## 2022-06-09 DIAGNOSIS — R69 Illness, unspecified: Secondary | ICD-10-CM | POA: Diagnosis not present

## 2022-06-09 DIAGNOSIS — F411 Generalized anxiety disorder: Secondary | ICD-10-CM | POA: Diagnosis not present

## 2022-06-22 DIAGNOSIS — M25512 Pain in left shoulder: Secondary | ICD-10-CM | POA: Diagnosis not present

## 2022-06-24 DIAGNOSIS — H40013 Open angle with borderline findings, low risk, bilateral: Secondary | ICD-10-CM | POA: Diagnosis not present

## 2022-06-24 DIAGNOSIS — H43813 Vitreous degeneration, bilateral: Secondary | ICD-10-CM | POA: Diagnosis not present

## 2022-06-24 DIAGNOSIS — H25813 Combined forms of age-related cataract, bilateral: Secondary | ICD-10-CM | POA: Diagnosis not present

## 2022-07-02 ENCOUNTER — Other Ambulatory Visit: Payer: Medicare HMO

## 2022-07-08 DIAGNOSIS — M25512 Pain in left shoulder: Secondary | ICD-10-CM | POA: Diagnosis not present

## 2022-07-13 DIAGNOSIS — K641 Second degree hemorrhoids: Secondary | ICD-10-CM | POA: Diagnosis not present

## 2022-08-12 DIAGNOSIS — M7542 Impingement syndrome of left shoulder: Secondary | ICD-10-CM | POA: Diagnosis not present

## 2022-08-12 DIAGNOSIS — M542 Cervicalgia: Secondary | ICD-10-CM | POA: Diagnosis not present

## 2022-09-07 DIAGNOSIS — M542 Cervicalgia: Secondary | ICD-10-CM | POA: Diagnosis not present

## 2022-09-24 DIAGNOSIS — M48061 Spinal stenosis, lumbar region without neurogenic claudication: Secondary | ICD-10-CM | POA: Diagnosis not present

## 2022-09-24 DIAGNOSIS — M5412 Radiculopathy, cervical region: Secondary | ICD-10-CM | POA: Diagnosis not present

## 2022-09-24 DIAGNOSIS — M79641 Pain in right hand: Secondary | ICD-10-CM | POA: Diagnosis not present

## 2022-09-24 DIAGNOSIS — M25561 Pain in right knee: Secondary | ICD-10-CM | POA: Diagnosis not present

## 2022-10-07 DIAGNOSIS — I10 Essential (primary) hypertension: Secondary | ICD-10-CM | POA: Diagnosis not present

## 2022-10-07 DIAGNOSIS — K529 Noninfective gastroenteritis and colitis, unspecified: Secondary | ICD-10-CM | POA: Diagnosis not present

## 2022-10-08 DIAGNOSIS — K529 Noninfective gastroenteritis and colitis, unspecified: Secondary | ICD-10-CM | POA: Diagnosis not present

## 2022-11-23 DIAGNOSIS — F411 Generalized anxiety disorder: Secondary | ICD-10-CM | POA: Diagnosis not present

## 2022-11-25 DIAGNOSIS — M5030 Other cervical disc degeneration, unspecified cervical region: Secondary | ICD-10-CM | POA: Diagnosis not present

## 2022-11-25 DIAGNOSIS — M48061 Spinal stenosis, lumbar region without neurogenic claudication: Secondary | ICD-10-CM | POA: Diagnosis not present

## 2022-12-07 ENCOUNTER — Other Ambulatory Visit: Payer: Medicare HMO

## 2022-12-28 DIAGNOSIS — H43813 Vitreous degeneration, bilateral: Secondary | ICD-10-CM | POA: Diagnosis not present

## 2022-12-28 DIAGNOSIS — H25813 Combined forms of age-related cataract, bilateral: Secondary | ICD-10-CM | POA: Diagnosis not present

## 2022-12-28 DIAGNOSIS — H532 Diplopia: Secondary | ICD-10-CM | POA: Diagnosis not present

## 2022-12-28 DIAGNOSIS — H40013 Open angle with borderline findings, low risk, bilateral: Secondary | ICD-10-CM | POA: Diagnosis not present

## 2023-03-30 ENCOUNTER — Ambulatory Visit
Admission: RE | Admit: 2023-03-30 | Discharge: 2023-03-30 | Disposition: A | Discharge status: Home / Self Care | Payer: Medicare HMO | Source: Ambulatory Visit | Attending: Internal Medicine | Admitting: Internal Medicine

## 2023-03-30 DIAGNOSIS — M8588 Other specified disorders of bone density and structure, other site: Secondary | ICD-10-CM

## 2023-03-30 DIAGNOSIS — E2839 Other primary ovarian failure: Secondary | ICD-10-CM | POA: Diagnosis not present

## 2023-03-30 DIAGNOSIS — N958 Other specified menopausal and perimenopausal disorders: Secondary | ICD-10-CM | POA: Diagnosis not present

## 2023-04-14 ENCOUNTER — Other Ambulatory Visit: Payer: Self-pay | Admitting: Internal Medicine

## 2023-04-14 DIAGNOSIS — Z1231 Encounter for screening mammogram for malignant neoplasm of breast: Secondary | ICD-10-CM

## 2023-04-22 DIAGNOSIS — F411 Generalized anxiety disorder: Secondary | ICD-10-CM | POA: Diagnosis not present

## 2023-05-07 ENCOUNTER — Ambulatory Visit: Payer: Medicare HMO

## 2023-05-07 ENCOUNTER — Ambulatory Visit
Admission: RE | Admit: 2023-05-07 | Discharge: 2023-05-07 | Disposition: A | Discharge status: Home / Self Care | Payer: Medicare Other | Source: Ambulatory Visit | Attending: Internal Medicine | Admitting: Internal Medicine

## 2023-05-07 DIAGNOSIS — Z1231 Encounter for screening mammogram for malignant neoplasm of breast: Secondary | ICD-10-CM | POA: Diagnosis not present

## 2023-05-10 DIAGNOSIS — K080 Exfoliation of teeth due to systemic causes: Secondary | ICD-10-CM | POA: Diagnosis not present

## 2023-05-19 IMAGING — MG MM DIGITAL SCREENING BILAT W/ TOMO AND CAD
8 series · 8 of 24 positions shown · non-contrast
Comparison: Previous exam(s).

CLINICAL DATA: Screening.

EXAM:
DIGITAL SCREENING BILATERAL MAMMOGRAM WITH TOMOSYNTHESIS AND CAD
TECHNIQUE: Bilateral screening digital craniocaudal and mediolateral oblique
mammograms were obtained. Bilateral screening digital breast
tomosynthesis was performed. The images were evaluated with
computer-aided detection.

[L MLO synth-2D]
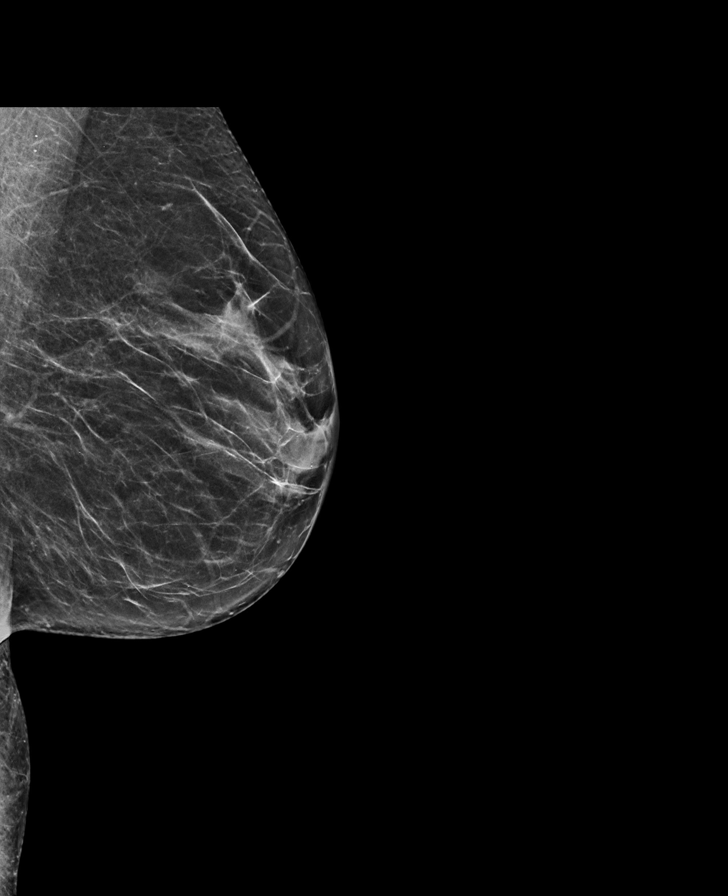

[R CC synth-2D]
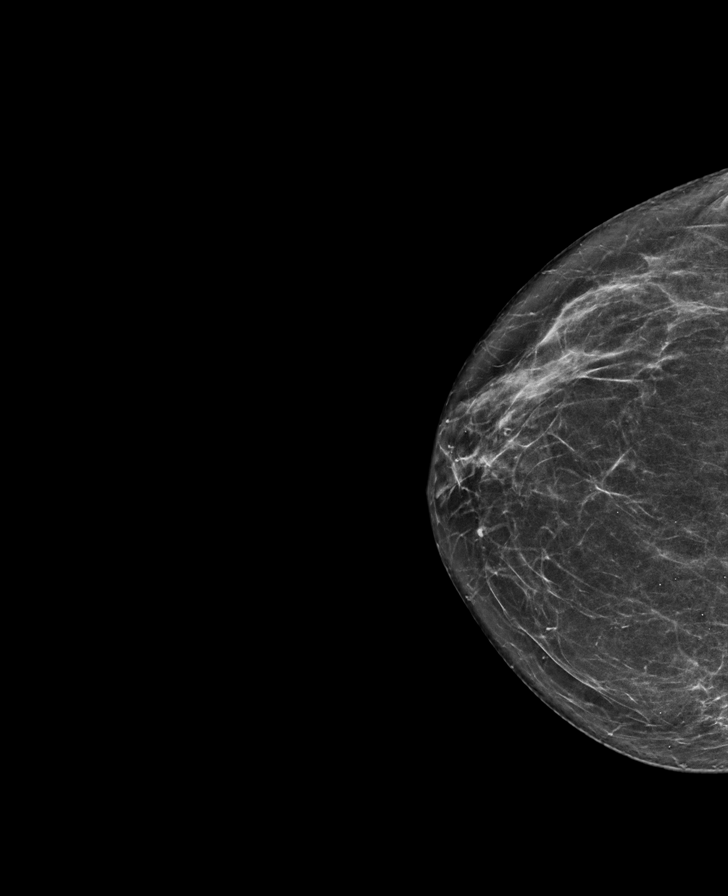

[L CC synth-2D]
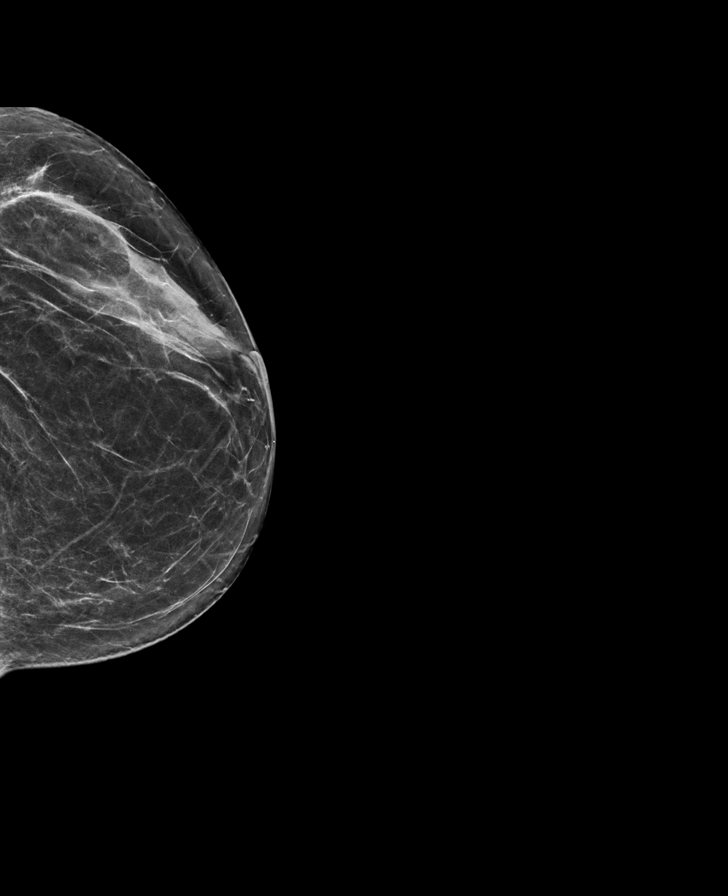

[R MLO synth-2D]
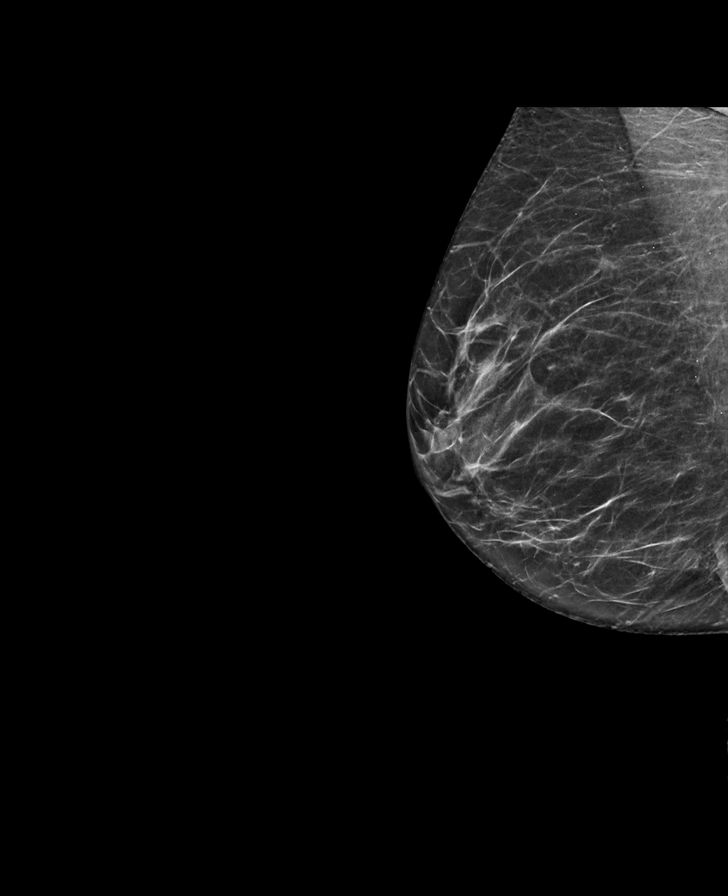

[R MLO tomo · tomo slice 33/64.0]
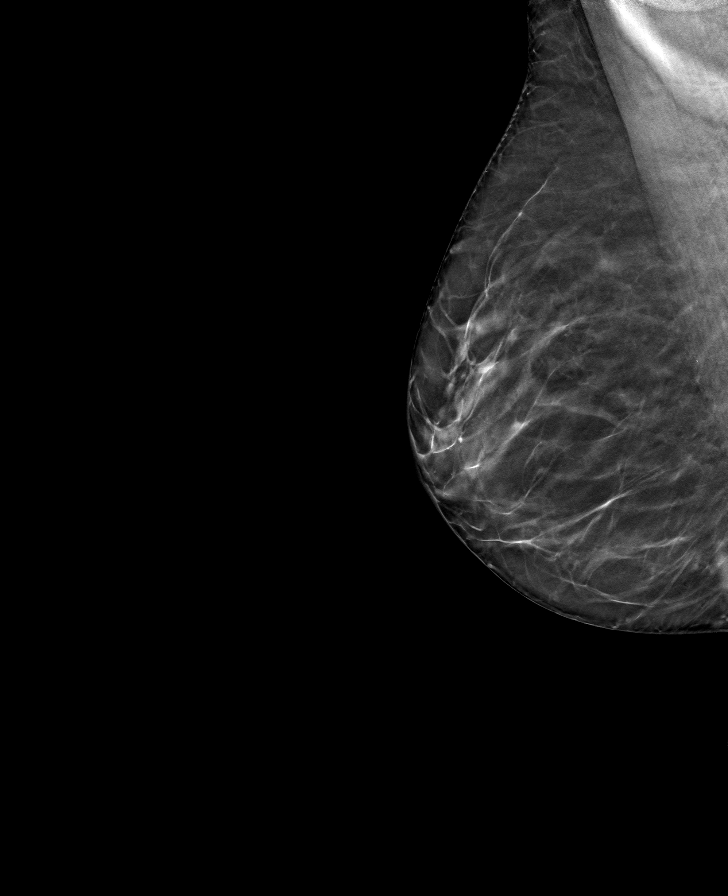

[L CC tomo · tomo slice 33/65.0]
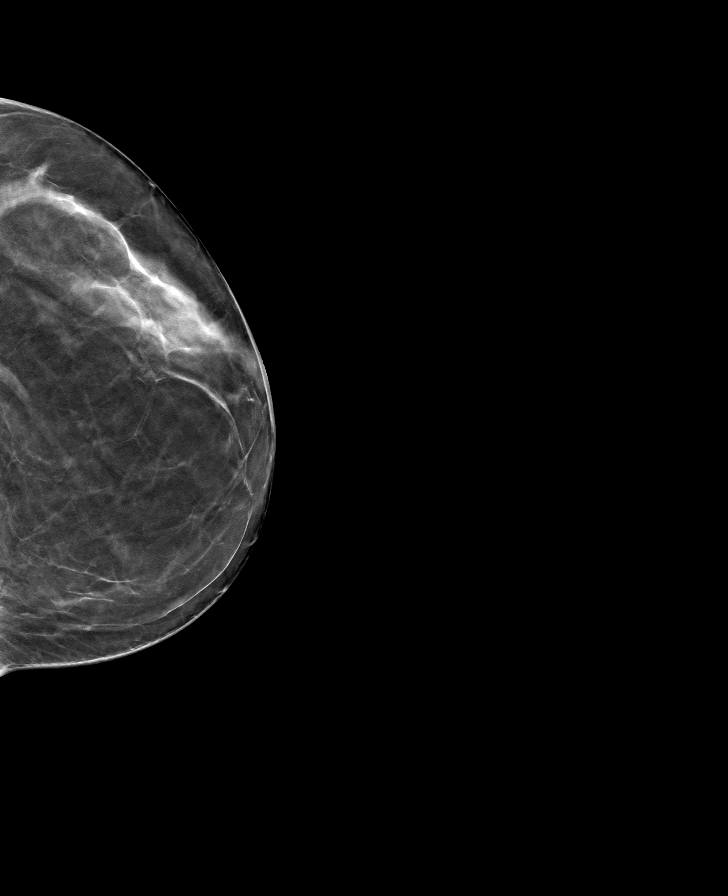

[R CC tomo · tomo slice 33/64.0]
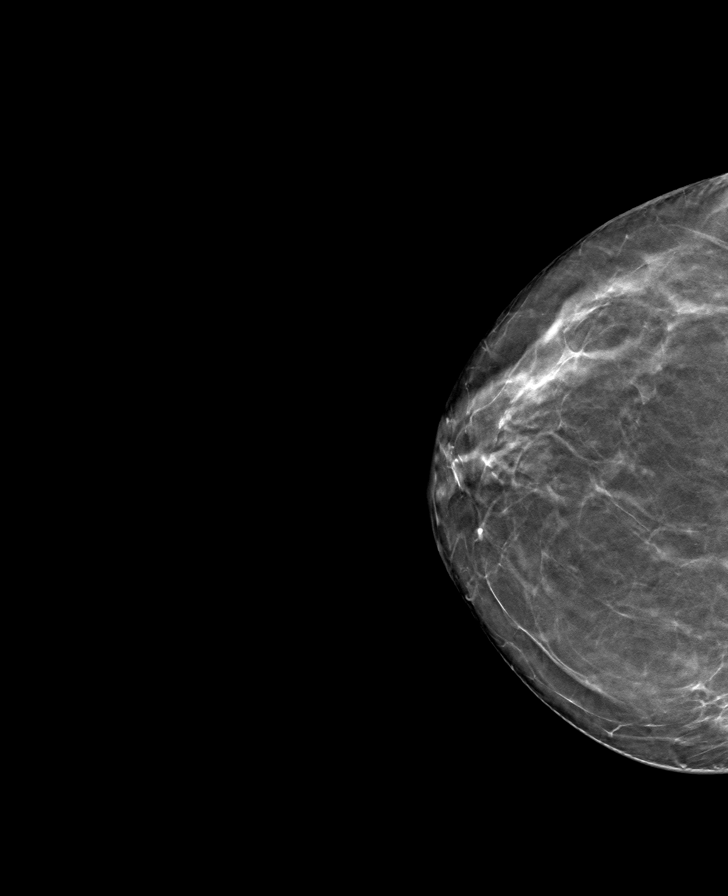

[L MLO tomo · tomo slice 31/62.0]
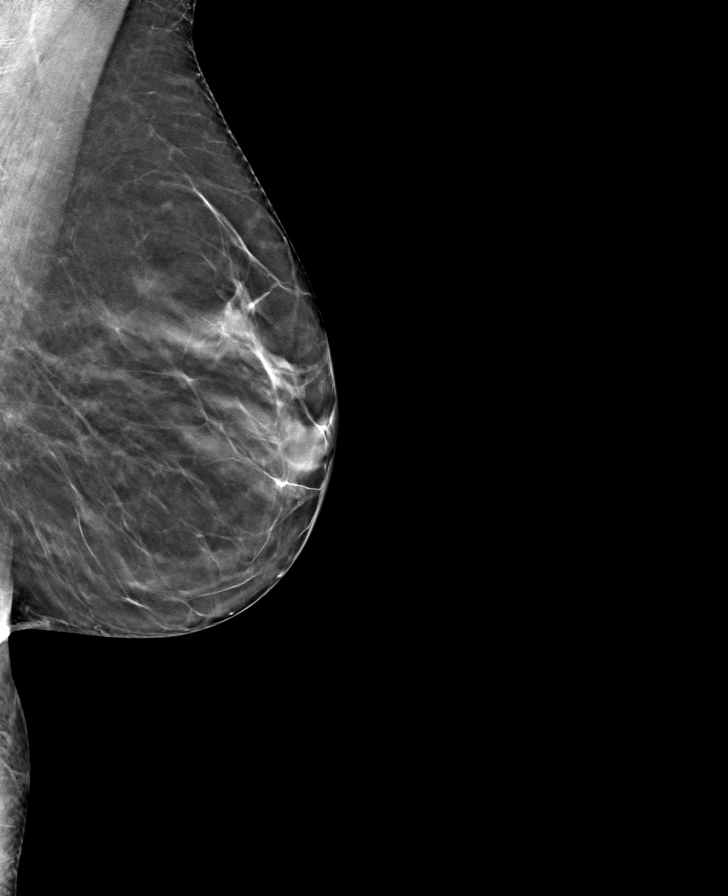

[8 of 24 positions shown; findings below may reference images not displayed]

ACR Breast Density Category b: There are scattered areas of
fibroglandular density.
FINDINGS: There are no findings suspicious for malignancy.
IMPRESSION: No mammographic evidence of malignancy. A result letter of this
screening mammogram will be mailed directly to the patient.

RECOMMENDATION:
Screening mammogram in one year. (Code:51-O-LD2)

BI-RADS CATEGORY  1: Negative.

## 2023-06-02 DIAGNOSIS — E7800 Pure hypercholesterolemia, unspecified: Secondary | ICD-10-CM | POA: Diagnosis not present

## 2023-06-02 DIAGNOSIS — M8588 Other specified disorders of bone density and structure, other site: Secondary | ICD-10-CM | POA: Diagnosis not present

## 2023-06-02 DIAGNOSIS — Z0000 Encounter for general adult medical examination without abnormal findings: Secondary | ICD-10-CM | POA: Diagnosis not present

## 2023-06-02 DIAGNOSIS — N3281 Overactive bladder: Secondary | ICD-10-CM | POA: Diagnosis not present

## 2023-06-02 DIAGNOSIS — I10 Essential (primary) hypertension: Secondary | ICD-10-CM | POA: Diagnosis not present

## 2023-06-25 DIAGNOSIS — M1990 Unspecified osteoarthritis, unspecified site: Secondary | ICD-10-CM | POA: Diagnosis not present

## 2023-06-30 DIAGNOSIS — H43813 Vitreous degeneration, bilateral: Secondary | ICD-10-CM | POA: Diagnosis not present

## 2023-06-30 DIAGNOSIS — H25813 Combined forms of age-related cataract, bilateral: Secondary | ICD-10-CM | POA: Diagnosis not present

## 2023-06-30 DIAGNOSIS — H40013 Open angle with borderline findings, low risk, bilateral: Secondary | ICD-10-CM | POA: Diagnosis not present

## 2023-07-22 DIAGNOSIS — M5412 Radiculopathy, cervical region: Secondary | ICD-10-CM | POA: Diagnosis not present

## 2023-07-28 DIAGNOSIS — S79911A Unspecified injury of right hip, initial encounter: Secondary | ICD-10-CM | POA: Diagnosis not present

## 2023-07-28 DIAGNOSIS — M25551 Pain in right hip: Secondary | ICD-10-CM | POA: Diagnosis not present

## 2023-07-28 DIAGNOSIS — S7001XA Contusion of right hip, initial encounter: Secondary | ICD-10-CM | POA: Diagnosis not present

## 2023-07-28 DIAGNOSIS — W109XXA Fall (on) (from) unspecified stairs and steps, initial encounter: Secondary | ICD-10-CM | POA: Diagnosis not present

## 2023-08-04 DIAGNOSIS — Z96641 Presence of right artificial hip joint: Secondary | ICD-10-CM | POA: Diagnosis not present

## 2023-08-04 DIAGNOSIS — M25551 Pain in right hip: Secondary | ICD-10-CM | POA: Diagnosis not present

## 2023-08-04 DIAGNOSIS — S79911A Unspecified injury of right hip, initial encounter: Secondary | ICD-10-CM | POA: Diagnosis not present

## 2023-08-18 DIAGNOSIS — M7061 Trochanteric bursitis, right hip: Secondary | ICD-10-CM | POA: Diagnosis not present

## 2023-08-18 DIAGNOSIS — Z96641 Presence of right artificial hip joint: Secondary | ICD-10-CM | POA: Diagnosis not present

## 2023-08-26 DIAGNOSIS — M5412 Radiculopathy, cervical region: Secondary | ICD-10-CM | POA: Diagnosis not present

## 2023-09-13 DIAGNOSIS — M7061 Trochanteric bursitis, right hip: Secondary | ICD-10-CM | POA: Diagnosis not present

## 2023-09-13 DIAGNOSIS — Z96641 Presence of right artificial hip joint: Secondary | ICD-10-CM | POA: Diagnosis not present

## 2023-09-20 DIAGNOSIS — F411 Generalized anxiety disorder: Secondary | ICD-10-CM | POA: Diagnosis not present

## 2023-09-27 DIAGNOSIS — M25551 Pain in right hip: Secondary | ICD-10-CM | POA: Diagnosis not present

## 2023-09-30 DIAGNOSIS — M7061 Trochanteric bursitis, right hip: Secondary | ICD-10-CM | POA: Diagnosis not present

## 2023-09-30 DIAGNOSIS — M25551 Pain in right hip: Secondary | ICD-10-CM | POA: Diagnosis not present

## 2023-10-06 DIAGNOSIS — M25551 Pain in right hip: Secondary | ICD-10-CM | POA: Diagnosis not present

## 2023-11-09 DIAGNOSIS — K080 Exfoliation of teeth due to systemic causes: Secondary | ICD-10-CM | POA: Diagnosis not present

## 2023-11-16 DIAGNOSIS — J0101 Acute recurrent maxillary sinusitis: Secondary | ICD-10-CM | POA: Diagnosis not present

## 2023-12-24 DIAGNOSIS — H25813 Combined forms of age-related cataract, bilateral: Secondary | ICD-10-CM | POA: Diagnosis not present

## 2023-12-24 DIAGNOSIS — H40013 Open angle with borderline findings, low risk, bilateral: Secondary | ICD-10-CM | POA: Diagnosis not present

## 2023-12-24 DIAGNOSIS — H532 Diplopia: Secondary | ICD-10-CM | POA: Diagnosis not present

## 2024-01-18 DIAGNOSIS — K080 Exfoliation of teeth due to systemic causes: Secondary | ICD-10-CM | POA: Diagnosis not present

## 2024-01-20 DIAGNOSIS — K080 Exfoliation of teeth due to systemic causes: Secondary | ICD-10-CM | POA: Diagnosis not present

## 2024-02-08 DIAGNOSIS — F411 Generalized anxiety disorder: Secondary | ICD-10-CM | POA: Diagnosis not present

## 2024-02-28 DIAGNOSIS — F411 Generalized anxiety disorder: Secondary | ICD-10-CM | POA: Diagnosis not present

## 2024-06-15 ENCOUNTER — Ambulatory Visit: Admitting: Obstetrics
